# Patient Record
Sex: Female | Born: 1985 | Race: White | Hispanic: No | Marital: Married | Smoking: Current every day smoker
Health system: Southern US, Community
[De-identification: ages and names within clinical notes are randomized; demographics above are authoritative.]

## PROBLEM LIST (undated history)

## (undated) ENCOUNTER — Inpatient Hospital Stay (HOSPITAL_COMMUNITY): Payer: Self-pay

## (undated) DIAGNOSIS — E611 Iron deficiency: Secondary | ICD-10-CM

## (undated) DIAGNOSIS — Z8379 Family history of other diseases of the digestive system: Secondary | ICD-10-CM

## (undated) DIAGNOSIS — N390 Urinary tract infection, site not specified: Secondary | ICD-10-CM

## (undated) DIAGNOSIS — Z820 Family history of epilepsy and other diseases of the nervous system: Secondary | ICD-10-CM

## (undated) DIAGNOSIS — Z809 Family history of malignant neoplasm, unspecified: Secondary | ICD-10-CM

## (undated) DIAGNOSIS — Z8619 Personal history of other infectious and parasitic diseases: Secondary | ICD-10-CM

## (undated) DIAGNOSIS — Z833 Family history of diabetes mellitus: Secondary | ICD-10-CM

## (undated) DIAGNOSIS — F32A Depression, unspecified: Secondary | ICD-10-CM

## (undated) DIAGNOSIS — F1411 Cocaine abuse, in remission: Secondary | ICD-10-CM

## (undated) DIAGNOSIS — Z841 Family history of disorders of kidney and ureter: Secondary | ICD-10-CM

## (undated) DIAGNOSIS — K469 Unspecified abdominal hernia without obstruction or gangrene: Secondary | ICD-10-CM

## (undated) DIAGNOSIS — Z8249 Family history of ischemic heart disease and other diseases of the circulatory system: Secondary | ICD-10-CM

## (undated) DIAGNOSIS — Z8742 Personal history of other diseases of the female genital tract: Secondary | ICD-10-CM

## (undated) DIAGNOSIS — F329 Major depressive disorder, single episode, unspecified: Secondary | ICD-10-CM

## (undated) DIAGNOSIS — F419 Anxiety disorder, unspecified: Secondary | ICD-10-CM

## (undated) DIAGNOSIS — Z832 Family history of diseases of the blood and blood-forming organs and certain disorders involving the immune mechanism: Secondary | ICD-10-CM

## (undated) HISTORY — DX: Family history of disorders of kidney and ureter: Z84.1

## (undated) HISTORY — DX: Personal history of other diseases of the female genital tract: Z87.42

## (undated) HISTORY — DX: Family history of diabetes mellitus: Z83.3

## (undated) HISTORY — DX: Family history of malignant neoplasm, unspecified: Z80.9

## (undated) HISTORY — DX: Family history of other diseases of the digestive system: Z83.79

## (undated) HISTORY — DX: Personal history of other infectious and parasitic diseases: Z86.19

## (undated) HISTORY — DX: Family history of epilepsy and other diseases of the nervous system: Z82.0

## (undated) HISTORY — DX: Family history of diseases of the blood and blood-forming organs and certain disorders involving the immune mechanism: Z83.2

## (undated) HISTORY — DX: Iron deficiency: E61.1

## (undated) HISTORY — DX: Family history of ischemic heart disease and other diseases of the circulatory system: Z82.49

## (undated) HISTORY — DX: Urinary tract infection, site not specified: N39.0

---
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
## 2006-05-29 ENCOUNTER — Inpatient Hospital Stay (HOSPITAL_COMMUNITY): Admission: RE | Admit: 2006-05-29 | Discharge: 2006-06-02 | Payer: Self-pay | Admitting: Psychiatry

## 2006-05-29 ENCOUNTER — Emergency Department (HOSPITAL_COMMUNITY): Admission: EM | Admit: 2006-05-29 | Discharge: 2006-05-29 | Payer: Self-pay | Admitting: Emergency Medicine

## 2006-05-29 ENCOUNTER — Ambulatory Visit: Payer: Self-pay | Admitting: Psychiatry

## 2006-07-21 ENCOUNTER — Emergency Department (HOSPITAL_COMMUNITY): Admission: EM | Admit: 2006-07-21 | Discharge: 2006-07-22 | Payer: Self-pay | Admitting: Emergency Medicine

## 2007-04-03 ENCOUNTER — Ambulatory Visit: Payer: Self-pay | Admitting: Internal Medicine

## 2007-04-04 ENCOUNTER — Ambulatory Visit: Payer: Self-pay | Admitting: *Deleted

## 2009-09-30 ENCOUNTER — Emergency Department (HOSPITAL_COMMUNITY): Admission: EM | Admit: 2009-09-30 | Discharge: 2009-09-30 | Payer: Self-pay | Admitting: Emergency Medicine

## 2010-11-15 LAB — POCT PREGNANCY, URINE: Preg Test, Ur: NEGATIVE
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
## 2011-03-20 ENCOUNTER — Emergency Department (INDEPENDENT_AMBULATORY_CARE_PROVIDER_SITE_OTHER): Payer: Self-pay

## 2011-03-20 ENCOUNTER — Emergency Department (HOSPITAL_BASED_OUTPATIENT_CLINIC_OR_DEPARTMENT_OTHER)
Admission: EM | Admit: 2011-03-20 | Discharge: 2011-03-20 | Disposition: A | Discharge status: Home / Self Care | Payer: Self-pay | Attending: Emergency Medicine | Admitting: Emergency Medicine

## 2011-03-20 DIAGNOSIS — R1032 Left lower quadrant pain: Secondary | ICD-10-CM | POA: Insufficient documentation

## 2011-03-20 DIAGNOSIS — A499 Bacterial infection, unspecified: Secondary | ICD-10-CM | POA: Insufficient documentation

## 2011-03-20 DIAGNOSIS — F3289 Other specified depressive episodes: Secondary | ICD-10-CM | POA: Insufficient documentation

## 2011-03-20 DIAGNOSIS — N72 Inflammatory disease of cervix uteri: Secondary | ICD-10-CM

## 2011-03-20 DIAGNOSIS — F329 Major depressive disorder, single episode, unspecified: Secondary | ICD-10-CM | POA: Insufficient documentation

## 2011-03-20 DIAGNOSIS — B9689 Other specified bacterial agents as the cause of diseases classified elsewhere: Secondary | ICD-10-CM | POA: Insufficient documentation

## 2011-03-20 DIAGNOSIS — N760 Acute vaginitis: Secondary | ICD-10-CM | POA: Insufficient documentation

## 2011-03-20 HISTORY — DX: Unspecified abdominal hernia without obstruction or gangrene: K46.9

## 2011-03-20 HISTORY — DX: Anxiety disorder, unspecified: F41.9

## 2011-03-20 HISTORY — DX: Depression, unspecified: F32.A

## 2011-03-20 HISTORY — DX: Major depressive disorder, single episode, unspecified: F32.9

## 2011-03-20 LAB — URINALYSIS, ROUTINE W REFLEX MICROSCOPIC
Ketones, ur: NEGATIVE mg/dL
Leukocytes, UA: NEGATIVE
Nitrite: NEGATIVE
Protein, ur: NEGATIVE mg/dL
pH: 6.5 (ref 5.0–8.0)

## 2011-03-20 LAB — URINE MICROSCOPIC-ADD ON

## 2011-03-20 LAB — PREGNANCY, URINE: Preg Test, Ur: NEGATIVE

## 2011-03-20 LAB — WET PREP, GENITAL: WBC, Wet Prep HPF POC: NONE SEEN

## 2011-03-20 MED ORDER — METRONIDAZOLE 500 MG PO TABS: 500.0000 mg | ORAL_TABLET | Freq: Two times a day (BID) | ORAL | Status: AC

## 2011-03-20 MED ORDER — HYDROCODONE-ACETAMINOPHEN 5-500 MG PO TABS: 1.0000 | ORAL_TABLET | Freq: Four times a day (QID) | ORAL | Status: AC | PRN

## 2011-03-20 NOTE — ED Notes (Signed)
Pelvic Cart at bedside and ready for provider.

## 2011-03-20 NOTE — ED Provider Notes (Signed)
History     Chief Complaint  Patient presents with  . Abdominal Pain    LLQ pain   Patient is a 25 y.o. female presenting with abdominal pain. The history is provided by the patient. No language interpreter was used.  Abdominal Pain The primary symptoms of the illness include abdominal pain, nausea, vomiting and vaginal discharge. The primary symptoms of the illness do not include fever or dysuria. The current episode started 13 to 24 hours ago. The onset of the illness was sudden. The problem has not changed since onset. The vaginal discharge is not associated with dysuria.   The patient has not had a change in bowel habit. Symptoms associated with the illness do not include constipation. Significant associated medical issues do not include liver disease, substance abuse or diverticulitis.    Past Medical History  Diagnosis Date  . Depression   . Anxiety   . Hernia     History reviewed. No pertinent past surgical history.  No family history on file.  History  Substance Use Topics  . Smoking status: Current Some Day Smoker  . Smokeless tobacco: Not on file  . Alcohol Use: Yes     6 pack/ day    OB History    Grav Para Term Preterm Abortions TAB SAB Ect Mult Living                  Review of Systems  Constitutional: Negative for fever.  Gastrointestinal: Positive for nausea, vomiting and abdominal pain. Negative for constipation.  Genitourinary: Positive for vaginal discharge. Negative for dysuria.  All other systems reviewed and are negative.    Physical Exam  BP 119/77  Pulse 77  Temp(Src) 98.4 F (36.9 C) (Oral)  Resp 16  Ht 4\' 9"  (1.448 m)  Wt 105 lb (47.628 kg)  BMI 22.72 kg/m2  LMP 03/17/2011  Physical Exam  Vitals reviewed. Constitutional: She is oriented to person, place, and time. She appears well-developed and well-nourished.  Neck: Normal range of motion. Neck supple.  Cardiovascular: Normal rate and regular rhythm.   Pulmonary/Chest: Effort  normal.  Abdominal: Soft. Bowel sounds are normal. She exhibits no distension. There is no tenderness. There is no CVA tenderness.  Genitourinary: Cervix exhibits no motion tenderness. Right adnexum displays no tenderness. Left adnexum displays no tenderness.  Neurological: She is alert and oriented to person, place, and time.  Skin: Skin is warm and dry.  Psychiatric: She has a normal mood and affect.    ED Course  Procedures  Results for orders placed during the hospital encounter of 03/20/11  URINALYSIS, ROUTINE W REFLEX MICROSCOPIC      Component Value Range   Color, Urine YELLOW  YELLOW    Appearance CLOUDY (*) CLEAR    Specific Gravity, Urine 1.012  1.005 - 1.030    pH 6.5  5.0 - 8.0    Glucose, UA NEGATIVE  NEGATIVE (mg/dL)   Hgb urine dipstick TRACE (*) NEGATIVE    Bilirubin Urine NEGATIVE  NEGATIVE    Ketones, ur NEGATIVE  NEGATIVE (mg/dL)   Protein, ur NEGATIVE  NEGATIVE (mg/dL)   Urobilinogen, UA 0.2  0.0 - 1.0 (mg/dL)   Nitrite NEGATIVE  NEGATIVE    Leukocytes, UA NEGATIVE  NEGATIVE   PREGNANCY, URINE      Component Value Range   Preg Test, Ur NEGATIVE    WET PREP, GENITAL      Component Value Range   Yeast, Wet Prep NONE SEEN  NONE SEEN  Trich, Wet Prep NONE SEEN  NONE SEEN    Clue Cells, Wet Prep FEW (*) NONE SEEN    WBC, Wet Prep HPF POC NONE SEEN  NONE SEEN   URINE MICROSCOPIC-ADD ON      Component Value Range   Squamous Epithelial / LPF FEW (*) RARE    WBC, UA 0-2  <3 (WBC/hpf)   RBC / HPF 0-2  <3 (RBC/hpf)   US Transvaginal Non-ob  03/20/2011  *RADIOLOGY REPORT*  Clinical Data: Left lower abdominal pain.  TRANSABDOMINAL AND TRANSVAGINAL ULTRASOUND OF PELVIS  Technique:  Both transabdominal and transvaginal ultrasound examinations of the pelvis were performed including evaluation of the uterus, ovaries, adnexal regions, and pelvic cul-de-sac.  Comparison: None.  Findings:  Uterus: 5.7 x 2.6 x 3.0 cm.  Small Nabothian cyst.  Normal echotexture.  No other  focal abnormality.  Endometrium: Normal in appearance and thickness, 4 mm.  Right Ovary: 3.2 x 2.6 x 2.4 cm.  Multiple small follicles.  Left Ovary: 2.7 x 2.3 x 2.9 cm.  Multiple small follicles.  Other Findings:  Trace free fluid in the pelvis.  This is likely physiologic.  IMPRESSION: Unremarkable study.  Original Report Authenticated By: Cyndie Chime, M.D.   US Pelvis Complete  03/20/2011  *RADIOLOGY REPORT*  Clinical Data: Left lower abdominal pain.  TRANSABDOMINAL AND TRANSVAGINAL ULTRASOUND OF PELVIS  Technique:  Both transabdominal and transvaginal ultrasound examinations of the pelvis were performed including evaluation of the uterus, ovaries, adnexal regions, and pelvic cul-de-sac.  Comparison: None.  Findings:  Uterus: 5.7 x 2.6 x 3.0 cm.  Small Nabothian cyst.  Normal echotexture.  No other focal abnormality.  Endometrium: Normal in appearance and thickness, 4 mm.  Right Ovary: 3.2 x 2.6 x 2.4 cm.  Multiple small follicles.  Left Ovary: 2.7 x 2.3 x 2.9 cm.  Multiple small follicles.  Other Findings:  Trace free fluid in the pelvis.  This is likely physiologic.  IMPRESSION: Unremarkable study.  Original Report Authenticated By: Cyndie Chime, M.D.      MDM No infection in urine:pt not pregnant:no sign of cyst or torsion:unlikely diverticulitis:pt refusing blood work:will treat for bv and symptomatically      Teressa Lower, NP 03/20/11 1404

## 2011-03-20 NOTE — ED Notes (Signed)
LLQ pain since yesterday;; nausea with no vomiting; some diarrhea this am; does have abnormal vaginal d/c as well

## 2011-03-21 LAB — GC/CHLAMYDIA PROBE AMP, GENITAL: Chlamydia, DNA Probe: NEGATIVE

## 2011-03-21 NOTE — ED Provider Notes (Signed)
Medical screening examination/treatment/procedure(s) were performed by non-physician practitioner and as supervising physician I was immediately available for consultation/collaboration.  Riley Lam [facility] 03/21/11 1543

## 2011-08-28 NOTE — L&D Delivery Note (Signed)
Delivery Note  At 1450 attempt to have pt push, and was ineffective, then tried several position changes and pt was eventually able to push, FHR remained reassuring Anesthesia was called to BS to redose epidural    At 4:13 PM a viable female was delivered via Vaginal, Spontaneous Delivery (Presentation: Right Occiput Anterior). Shoulders delivered easily,  APGAR: 8, 9; weight 7 lb 1 oz (3204 g).   Placenta status: Intact, Spontaneous, sent to pathology for dx of chorioamnionitis  Pathology.  Cord: 3 vessels with the following complications: None.  Cord pH: n/a  Anesthesia: Epidural  Episiotomy: None Lacerations: 2nd degree;Perineal Suture Repair: 3.0 vicryl rapide Est. Blood Loss (mL): 200  Mom to postpartum.  Baby to nursery-stable. Skin-skin w pt Plans to BF   Zykiria Bruening M 05/26/2012, 9:32 PM

## 2011-11-06 ENCOUNTER — Encounter (INDEPENDENT_AMBULATORY_CARE_PROVIDER_SITE_OTHER): Payer: Medicaid Other

## 2011-11-06 ENCOUNTER — Other Ambulatory Visit: Payer: Self-pay

## 2011-11-06 DIAGNOSIS — Z331 Pregnant state, incidental: Secondary | ICD-10-CM

## 2011-11-06 LAB — OB RESULTS CONSOLE HIV ANTIBODY (ROUTINE TESTING): HIV: NONREACTIVE

## 2011-11-06 LAB — OB RESULTS CONSOLE RPR: RPR: NONREACTIVE

## 2011-11-06 LAB — OB RESULTS CONSOLE HEPATITIS B SURFACE ANTIGEN: Hepatitis B Surface Ag: NEGATIVE

## 2011-11-06 LAB — OB RESULTS CONSOLE ABO/RH: RH Type: POSITIVE

## 2011-11-07 ENCOUNTER — Encounter (HOSPITAL_COMMUNITY): Payer: Self-pay | Admitting: MS"

## 2011-11-14 ENCOUNTER — Ambulatory Visit (HOSPITAL_COMMUNITY)
Admission: RE | Admit: 2011-11-14 | Discharge: 2011-11-14 | Disposition: A | Discharge status: Home / Self Care | Payer: Medicaid Other | Source: Ambulatory Visit | Attending: Obstetrics and Gynecology | Admitting: Obstetrics and Gynecology

## 2011-11-14 ENCOUNTER — Other Ambulatory Visit: Payer: Self-pay | Admitting: Obstetrics and Gynecology

## 2011-11-14 ENCOUNTER — Ambulatory Visit (HOSPITAL_COMMUNITY): Admission: RE | Admit: 2011-11-14 | Payer: Medicaid Other | Source: Ambulatory Visit

## 2011-11-14 ENCOUNTER — Encounter (HOSPITAL_COMMUNITY): Payer: Self-pay

## 2011-11-14 DIAGNOSIS — Z369 Encounter for antenatal screening, unspecified: Secondary | ICD-10-CM

## 2011-11-14 DIAGNOSIS — Z3687 Encounter for antenatal screening for uncertain dates: Secondary | ICD-10-CM

## 2011-11-14 DIAGNOSIS — Z0489 Encounter for examination and observation for other specified reasons: Secondary | ICD-10-CM

## 2011-11-14 DIAGNOSIS — Z3689 Encounter for other specified antenatal screening: Secondary | ICD-10-CM

## 2011-11-14 DIAGNOSIS — O99891 Other specified diseases and conditions complicating pregnancy: Secondary | ICD-10-CM | POA: Insufficient documentation

## 2011-11-15 ENCOUNTER — Ambulatory Visit (HOSPITAL_COMMUNITY): Payer: Self-pay

## 2011-11-15 ENCOUNTER — Encounter (HOSPITAL_COMMUNITY): Payer: Self-pay

## 2011-11-16 NOTE — Progress Notes (Signed)
Genetic Counseling  High-Risk Gestation Note  Appointment Date:  11/16/2011 Referred By: Michael Litter, MD Date of Birth:  08/13/1986     Pregnancy History: G2P0010 Estimated Date of Delivery: 05/20/12 Estimated Gestational Age: [redacted]w[redacted]d Attending: Rica Koyanagi, MD   [name] was seen for genetic counseling because of Xanax use in pregnancy.     [name] reported taking alprazolam (0.5 mg) approximately 3 times per week and is attempting to wean from this medication. She reported that this is prescribed by her family physician, and that she planned to make an appointment at [facility] once she left her appointment in our office today.    Before assessing any risk to a pregnancy, it is important to know that any time a couple conceives, there is a 3-5% chance that the baby will be affected by a birth defect or have mental retardation. This is the background risk that all couples in the general population face. For this reason, it is often difficult to determine if a drug is responsible for causing a pregnancy loss or birth defect. Therefore, in many cases, we cannot provide specific information and can only caution about possible associations between a certain exposure and a specific birth defect. We discussed that the critical period for fetal organ development is from the third through the eighth weeks after conception. Exposures during this time might have the potential for disrupting the beginning of the development of the biological systems.   Many factors contribute to what the effects, if any, a prenatal exposure will have on a pregnancy. Drug dosage (the quantity of drug exposed), timing (when during pregnancy did the exposure occur), the number of exposures to a substance, any drug-drug interactions with other substances taken during pregnancy, the genetic make-up of the mother, and the genetic make-up of a fetus all contribute to the consequences of any  prenatal exposure(s). We discussed that we are not able to assess all of these factors when assessing the risk of an exposure in pregnancy.   Available animal study data do not suggest an increased risk for birth defects with prenatal alprazolam (Xanax) use, except at very high dose exposure. Available human data regarding Xanax use in pregnancy have not indicated an increased risk for birth defects. We reviewed that other benzodiazepines have been suggested to increase the risk for birth defects, such as cleft lip and palate. However, this association has not been proven. We discussed that third trimester use of alprazolam is associated with an increased risk for neonatal withdrawal symptoms.   Even though a limited number of medicines are known to cause birth defects, we cannot say that it is completely safe to use any medicines during pregnancy.  It is also not possible to predict any drug-drug interactions that occur, or how they might affect a pregnancy.  The use of medications is recommended in pregnancy only if the benefit to the mother (and thus the pregnancy) outweighs potential risks to the baby.  Sometimes the maternal use of medications, dictated by a medical condition, may even be more beneficial to a pregnancy than not taking the medication(s) at all.   [name] denied exposure to environmental toxins. She denied the use of tobacco or street drugs. She reported drinking wine approximately 2 weeks ago.  We discussed that prenatal alcohol exposure can increase the risk for growth delays, small head size, heart defects, eye and facial differences, as well as behavior problems and learning disabilities. The risk of these  to occur tends to increase with the amount of alcohol consumed. However, because there is no identified safe amount of alcohol in pregnancy, it is recommended to completely avoid alcohol in pregnancy. Given the reported amount of exposure, risk for associated effects are likely  low in the current pregnancy. She denied significant viral illnesses during the course of her pregnancy. Her medical and surgical histories were noncontributory.   An ultrasound was performed at the time of today's visit to establish gestational age. The gestational age was consistent with 13 weeks 1 day, changing the EDC to 05/20/12. Complete ultrasound results reported separately. We discussed the screening options of First screen and Quad screen. The patient understands that screening adjusts the a priori risk (typically based on age) for certain conditions to provide a pregnancy specific risk assessment. We reviewed chromosomes, nondisjunction, and examples of chromosome conditions, including down syndrome. We discussed that given the patient's age alone, the pregnancy is not considered to be at increased risk for aneuploidy. After reviewing these options, [name] elected to have ultrasound only, but declined First screen.    [name] was provided with written information regarding cystic fibrosis (CF) including the carrier frequency and incidence in the Caucasian population, the availability of carrier testing and prenatal diagnosis if indicated.  In addition, we discussed that CF is routinely screened for as part of the [place] newborn screening panel.  She declined testing today.   Both family histories were reviewed and found to be contributory for anxiety. The patient reported that she and a paternal aunt have anxiety. The father of the pregnancy has attention deficit hyperactivity disorder (ADHD). Mental health and behavioral disorders, such as anxiety and ADHD are typically thought to be due to multifactorial inheritance (combination of environmental and hereditary factors). We discussed that recurrence risk for these conditions would be increased for the couple's child(ren). She understands that we are not able to screen or test prenatally for these conditions. It would be  important for the couple's pediatrician to be aware of this history so that children can be screened and followed appropriately. Without further information regarding the provided family history, an accurate genetic risk cannot be calculated. Further genetic counseling is warranted if more information is obtained.  I counseled [name] regarding the above risks and available options.  The approximate face-to-face time with the genetic counselor was 35 minutes.  Quinn Plowman, MS,  Certified [employer] 11/16/2011

## 2011-11-23 ENCOUNTER — Emergency Department (HOSPITAL_COMMUNITY)
Admission: EM | Admit: 2011-11-23 | Discharge: 2011-11-23 | Disposition: A | Discharge status: Home / Self Care | Payer: Medicaid Other | Attending: Emergency Medicine | Admitting: Emergency Medicine

## 2011-11-23 ENCOUNTER — Encounter (HOSPITAL_COMMUNITY): Payer: Self-pay

## 2011-11-23 DIAGNOSIS — O210 Mild hyperemesis gravidarum: Secondary | ICD-10-CM | POA: Insufficient documentation

## 2011-11-23 DIAGNOSIS — R110 Nausea: Secondary | ICD-10-CM

## 2011-11-23 DIAGNOSIS — R42 Dizziness and giddiness: Secondary | ICD-10-CM | POA: Insufficient documentation

## 2011-11-23 DIAGNOSIS — F341 Dysthymic disorder: Secondary | ICD-10-CM | POA: Insufficient documentation

## 2011-11-23 DIAGNOSIS — Z3490 Encounter for supervision of normal pregnancy, unspecified, unspecified trimester: Secondary | ICD-10-CM

## 2011-11-23 LAB — URINALYSIS, ROUTINE W REFLEX MICROSCOPIC
Ketones, ur: 15 mg/dL — AB
Leukocytes, UA: NEGATIVE
Nitrite: NEGATIVE
Protein, ur: NEGATIVE mg/dL

## 2011-11-23 LAB — POCT I-STAT, CHEM 8
BUN: 3 mg/dL — ABNORMAL LOW (ref 6–23)
Calcium, Ion: 1.17 mmol/L (ref 1.12–1.32)
Chloride: 103 meq/L (ref 96–112)
Creatinine, Ser: 0.6 mg/dL (ref 0.50–1.10)
Glucose, Bld: 97 mg/dL (ref 70–99)
HCT: 36 % (ref 36.0–46.0)
Hemoglobin: 12.2 g/dL (ref 12.0–15.0)
Potassium: 3.4 meq/L — ABNORMAL LOW (ref 3.5–5.1)
Sodium: 137 meq/L (ref 135–145)
TCO2: 22 mmol/L (ref 0–100)

## 2011-11-23 MED ORDER — MECLIZINE HCL 25 MG PO TABS
25.0000 mg | ORAL_TABLET | Freq: Once | ORAL | Status: AC
  Administered 2011-11-23: 25 mg via ORAL
  Filled 2011-11-23: qty 1

## 2011-11-23 MED ORDER — MECLIZINE HCL 50 MG PO TABS: ORAL_TABLET | ORAL | Status: DC

## 2011-11-23 MED ORDER — ONDANSETRON HCL 4 MG/2ML IJ SOLN
4.0000 mg | Freq: Once | INTRAMUSCULAR | Status: AC
  Administered 2011-11-23: 4 mg via INTRAVENOUS
  Filled 2011-11-23: qty 2

## 2011-11-23 MED ORDER — ONDANSETRON HCL 4 MG PO TABS: 4.0000 mg | ORAL_TABLET | Freq: Four times a day (QID) | ORAL | Status: AC

## 2011-11-23 MED ORDER — SODIUM CHLORIDE 0.9 % IV BOLUS (SEPSIS)
1000.0000 mL | Freq: Once | INTRAVENOUS | Status: AC
  Administered 2011-11-23: 1000 mL via INTRAVENOUS

## 2011-11-23 NOTE — ED Notes (Signed)
Pt ambulatory to restroom

## 2011-11-23 NOTE — Discharge Instructions (Signed)
Stay well hydrated. Take zofran as needed for nausea and meclizine as needed for dizziness. Follow up with ObGyn in the next few days for recheck of ongoing symptoms but return to ER for changing or worsening of symptoms.   Benign Positional Vertigo Vertigo means you feel like you or your surroundings are moving when they are not. Benign positional vertigo is the most common form of vertigo. Benign means that the cause of your condition is not serious. Benign positional vertigo is more common in older adults. CAUSES  Benign positional vertigo is the result of an upset in the labyrinth system. This is an area in the middle ear that helps control your balance. This may be caused by a viral infection, head injury, or repetitive motion. However, often no specific cause is found. SYMPTOMS  Symptoms of benign positional vertigo occur when you move your head or eyes in different directions. Some of the symptoms may include:  Loss of balance and falls.   Vomiting.   Blurred vision.   Dizziness.   Nausea.   Involuntary eye movements (nystagmus).  DIAGNOSIS  Benign positional vertigo is usually diagnosed by physical exam. If the specific cause of your benign positional vertigo is unknown, your caregiver may perform imaging tests, such as magnetic resonance imaging (MRI) or computed tomography (CT). TREATMENT  Your caregiver may recommend movements or procedures to correct the benign positional vertigo. Medicines such as meclizine, benzodiazepines, and medicines for nausea may be used to treat your symptoms. In rare cases, if your symptoms are caused by certain conditions that affect the inner ear, you may need surgery. HOME CARE INSTRUCTIONS   Follow your caregiver's instructions.   Move slowly. Do not make sudden body or head movements.   Avoid driving.   Avoid operating heavy machinery.   Avoid performing any tasks that would be dangerous to you or others during a vertigo episode.   Drink  enough fluids to keep your urine clear or pale yellow.  SEEK IMMEDIATE MEDICAL CARE IF:   You develop problems with walking, weakness, numbness, or using your arms, hands, or legs.   You have difficulty speaking.   You develop severe headaches.   Your nausea or vomiting continues or gets worse.   You develop visual changes.   Your family or friends notice any behavioral changes.   Your condition gets worse.   You have a fever.   You develop a stiff neck or sensitivity to light.  MAKE SURE YOU:   Understand these instructions.   Will watch your condition.   Will get help right away if you are not doing well or get worse.  Document Released: 05/21/2006 Document Revised: 08/02/2011 Document Reviewed: 05/03/2011 [facility] Patient Information 2012 [place], [place].

## 2011-11-23 NOTE — ED Notes (Signed)
Pt states she has had vomiting that started yesterday with multiple episodes.  Dizziness began last night.

## 2011-11-23 NOTE — Progress Notes (Signed)
Obstetric ultrasound performed today.   Fetal measurements do not agree with dating by LMP.  Recommend dating pregnancy by ultrasound today.   Ultrasound findings were discussed and the patient received genetic counseling (see separate report).  She declined screening for fetal chromosome anomalites.    Risks of Xanax use during pregnancy were discussed including but not limited to fetal growth abnormalities and neonatal abstinence syndromes.  Alternative management approaches to anxiety were encouraged and the need to carefully weigh risks and benefits with further benzodiazepine use described.  Patient has an appointment at [facility] to establish care with a mental health provider.  Precautions were given.   Recommend repeat ultrasound at [redacted] weeks gestation for evaluation of fetal anatomy.  We would be pleased to perform this exam.  If desired, please call to schedule.   If patient remains on benzodiazepine therapy, recommend serial ultrasound evaluation of fetal growth through the third trimester.   Please see full report in [facility].

## 2011-11-23 NOTE — ED Notes (Signed)
Pt passed PO challenge.  Pt able to tolerate liquids at this time.

## 2011-11-23 NOTE — ED Provider Notes (Signed)
History     CSN: [id number]  Arrival date & time 11/23/11  4098   First MD Initiated Contact with Patient 11/23/11 [phone]      Chief Complaint  Patient presents with  . Dizziness    (Consider location/radiation/quality/duration/timing/severity/associated sxs/prior treatment) HPI  Patient presents to emergency department complaining of vomiting and dizziness. Patient states that shortly after lunch yesterday she began to have vomiting acutely. Patient states that she had approximately 5 or 6 episodes of vomiting from lunch until supper. Patient states at suppertime she is able to hold down a small amount rice but about an hour later began to have dizziness. Patient states that upon lying to sitting or sitting to standing she would feel acutely dizzy. Patient states when she would try to walk she felt like the room was spinning. Patient states this persisted throughout the night with some intermittent dizziness when she tried to lay down to sleep. Patient states she's currently [redacted] weeks pregnant and has seen her OB/GYN once at [facility]. Patient is G2 P 0 A1. Patient states she's had some mild morning sickness with this pregnancy but no other complications. She denies any vaginal bleeding. She denies fevers, chills, upper respiratory tract infection symptoms, chest pain, shortness of breath, abdominal pain, diarrhea, vaginal bleeding, vaginal discharge, dysuria, hematuria. Patient takes xanax on an as-needed basis for anxiety and depression has no other known medical problems takes no other medicines on regular basis. Vomiting was acute onset, and resolve but had ongoing nausea. Dizziness was acute onset, intermittent, and persistent. Dizziness is aggravated by movement improved with lying still.  Past Medical History  Diagnosis Date  . Depression   . Anxiety   . Hernia     History reviewed. No pertinent past surgical history.  No family history on file.  History  Substance  Use Topics  . Smoking status: Current Some Day Smoker  . Smokeless tobacco: Not on file  . Alcohol Use: No     6 pack/ day    OB History    Grav Para Term Preterm Abortions TAB SAB Ect Mult Living   2 0 0 0 1 1 0 0 0 0       Review of Systems  All other systems reviewed and are negative.    Allergies  Review of patient's allergies indicates no known allergies.  Home Medications   Current Outpatient Rx  Name Route Sig Dispense Refill  . ALPRAZOLAM 0.5 MG PO TABS Oral Take 0.5 mg by mouth 3 (three) times daily as needed. For anxiety    . PRENATAL VITAMINS PO Oral Take 1 tablet by mouth daily.       BP 99/58  Pulse 80  Temp(Src) 97 F (36.1 C) (Oral)  Resp 18  SpO2 100%  LMP 07/28/2011  Physical Exam  Nursing note and vitals reviewed. Constitutional: She is oriented to person, place, and time. She appears well-developed and well-nourished. No distress.  HENT:  Head: Normocephalic and atraumatic.       Mucus membranes moist and pink  Eyes: Conjunctivae and EOM are normal. Pupils are equal, round, and reactive to light.       NO vertical or horizontal nystagmus  Neck: Normal range of motion. Neck supple.  Cardiovascular: Normal rate, regular rhythm, normal heart sounds and intact distal pulses.  Exam reveals no gallop and no friction rub.   No murmur heard. Pulmonary/Chest: Effort normal and breath sounds normal. No respiratory distress. She has no wheezes. She has  no rales. She exhibits no tenderness.  Abdominal: Soft. Bowel sounds are normal. She exhibits no distension and no mass. There is no tenderness. There is no rebound and no guarding.  Musculoskeletal: Normal range of motion. She exhibits no edema and no tenderness.  Neurological: She is alert and oriented to person, place, and time. No cranial nerve deficit. Coordination normal.  Skin: Skin is warm and dry. No rash noted. She is not diaphoretic. No erythema.  Psychiatric: She has a normal mood and affect.     ED Course  Procedures (including critical care time)  IV fluids, PO meclizine. IV zofran.   9:25 AM Patient is ambulating without difficulty and no ataxia  Labs Reviewed - No data to display No results found.   1. Dizziness   2. Nausea   3. Intrauterine normal pregnancy       MDM  Dizziness resolved after PO meclizine and fluids. Patient angulated without difficulty without signs or symptoms of ataxia or central vertigo. Patient denies any associated abdominal pain with abdomen soft and nontender. Patient has no horizontal or vertical nystagmus. No neuro focal findings. Denies vaginal bleeding.         [place], [place] 11/23/11 1023

## 2011-11-23 NOTE — ED Provider Notes (Signed)
Medical screening examination/treatment/procedure(s) were performed by non-physician practitioner and as supervising physician I was immediately available for consultation/collaboration.   Forbes Cellar, MD 11/23/11 1100

## 2011-11-23 NOTE — ED Notes (Signed)
Pt states slight dizziness in all three positions.

## 2011-11-30 ENCOUNTER — Encounter (INDEPENDENT_AMBULATORY_CARE_PROVIDER_SITE_OTHER): Payer: Medicaid Other | Admitting: Obstetrics and Gynecology

## 2011-11-30 ENCOUNTER — Other Ambulatory Visit: Payer: Self-pay

## 2011-11-30 DIAGNOSIS — Z3689 Encounter for other specified antenatal screening: Secondary | ICD-10-CM

## 2011-11-30 DIAGNOSIS — N898 Other specified noninflammatory disorders of vagina: Secondary | ICD-10-CM

## 2011-11-30 DIAGNOSIS — Z331 Pregnant state, incidental: Secondary | ICD-10-CM

## 2011-12-26 ENCOUNTER — Ambulatory Visit (INDEPENDENT_AMBULATORY_CARE_PROVIDER_SITE_OTHER): Payer: Medicaid Other | Admitting: Obstetrics and Gynecology

## 2011-12-26 ENCOUNTER — Ambulatory Visit (INDEPENDENT_AMBULATORY_CARE_PROVIDER_SITE_OTHER): Payer: Medicaid Other

## 2011-12-26 ENCOUNTER — Other Ambulatory Visit: Payer: Medicaid Other

## 2011-12-26 ENCOUNTER — Encounter: Payer: Self-pay | Admitting: Obstetrics and Gynecology

## 2011-12-26 VITALS — BP 90/64 | Wt 109.0 lb

## 2011-12-26 DIAGNOSIS — F419 Anxiety disorder, unspecified: Secondary | ICD-10-CM | POA: Insufficient documentation

## 2011-12-26 DIAGNOSIS — Z3689 Encounter for other specified antenatal screening: Secondary | ICD-10-CM

## 2011-12-26 DIAGNOSIS — F411 Generalized anxiety disorder: Secondary | ICD-10-CM

## 2011-12-26 DIAGNOSIS — F329 Major depressive disorder, single episode, unspecified: Secondary | ICD-10-CM | POA: Insufficient documentation

## 2011-12-26 DIAGNOSIS — Z3400 Encounter for supervision of normal first pregnancy, unspecified trimester: Secondary | ICD-10-CM

## 2011-12-26 LAB — US OB COMP + 14 WK

## 2011-12-26 NOTE — Patient Instructions (Signed)
Please give pamphlet low back pain in pregnancy.

## 2011-12-26 NOTE — Progress Notes (Signed)
Ultrasound shows:  SIUP  S=D     [place] EDD: c/w dates            AFI: normal           Cervical length: 3.65 cm           Placenta localization: anterior           Fetal presentation: breech                    Anatomy survey is normal           Gender : DNWTK Difficulty sleeping and LBP with position changes Pt decreasing Xanax. Only taking 1 every few days prn. Will try benadryl if doesn't help she may want to try ambien Pamphlet on LBP in pregnancy RTO 4wks

## 2012-01-25 ENCOUNTER — Encounter: Payer: Self-pay | Admitting: Obstetrics and Gynecology

## 2012-01-25 ENCOUNTER — Ambulatory Visit (INDEPENDENT_AMBULATORY_CARE_PROVIDER_SITE_OTHER): Payer: Medicaid Other | Admitting: Obstetrics and Gynecology

## 2012-01-25 VITALS — BP 102/56 | Wt 113.0 lb

## 2012-01-25 DIAGNOSIS — Z331 Pregnant state, incidental: Secondary | ICD-10-CM

## 2012-01-25 NOTE — Patient Instructions (Signed)
Patient Education Materials to be provided at check out (*indicates is located in accordion folder):  Easing Back Pain During Pregnancy  

## 2012-01-25 NOTE — Progress Notes (Signed)
Pt without c/o glucola @NV  Pt occ takes xanax prn

## 2012-01-29 ENCOUNTER — Telehealth: Payer: Self-pay | Admitting: Obstetrics and Gynecology

## 2012-01-29 NOTE — Telephone Encounter (Signed)
TC to pt. LM to return call returning call.

## 2012-01-29 NOTE — Telephone Encounter (Signed)
Triage/cht received 

## 2012-02-18 ENCOUNTER — Telehealth: Payer: Self-pay | Admitting: Obstetrics and Gynecology

## 2012-02-18 NOTE — Telephone Encounter (Signed)
PT CALLED, STATES HAS 1 HR GLUCOSE TESTING SCHED FOR Friday, WAS TOLD BY A FRIEND THAT IS A PT HERE THAT INSTEAD OF THE DRINK CAN DO JELLY BEANS INSTEAD.  INFORMED PT JELLY BEANS ARE AVAILABLE, PT TO ASK FOR JELLY BEANS @ APPT, PT VOICES UNDERSTANDING.

## 2012-02-18 NOTE — Telephone Encounter (Signed)
Triage/ob/epic °

## 2012-02-22 ENCOUNTER — Ambulatory Visit (INDEPENDENT_AMBULATORY_CARE_PROVIDER_SITE_OTHER): Payer: Medicaid Other | Admitting: Obstetrics and Gynecology

## 2012-02-22 ENCOUNTER — Encounter: Payer: Self-pay | Admitting: Obstetrics and Gynecology

## 2012-02-22 VITALS — BP 100/58 | Wt 116.0 lb

## 2012-02-22 DIAGNOSIS — Z331 Pregnant state, incidental: Secondary | ICD-10-CM

## 2012-02-22 NOTE — Progress Notes (Signed)
C/o L side low back pain Jelly beans given today without difficulty

## 2012-02-22 NOTE — Progress Notes (Signed)
Patient ID: Kristi Perkins, female   DOB: [birth year], 26 y.o.   MRN: [id number] Reviewed s/s preterm labor, srom, vag bleeding,daily kick counts to report, encouraged 8 water daily and frequent voids. 1 gtt, rpr, hgb today. A positive. Lavera Guise, CNM

## 2012-02-23 LAB — RPR

## 2012-02-26 ENCOUNTER — Encounter: Payer: Self-pay | Admitting: Obstetrics and Gynecology

## 2012-02-26 DIAGNOSIS — Z8742 Personal history of other diseases of the female genital tract: Secondary | ICD-10-CM

## 2012-02-26 DIAGNOSIS — E611 Iron deficiency: Secondary | ICD-10-CM

## 2012-03-07 ENCOUNTER — Encounter: Payer: Self-pay | Admitting: Obstetrics and Gynecology

## 2012-03-07 ENCOUNTER — Ambulatory Visit (INDEPENDENT_AMBULATORY_CARE_PROVIDER_SITE_OTHER): Payer: Medicaid Other | Admitting: Obstetrics and Gynecology

## 2012-03-07 VITALS — BP 104/66 | Wt 121.0 lb

## 2012-03-07 DIAGNOSIS — Z3490 Encounter for supervision of normal pregnancy, unspecified, unspecified trimester: Secondary | ICD-10-CM

## 2012-03-07 DIAGNOSIS — Z331 Pregnant state, incidental: Secondary | ICD-10-CM

## 2012-03-07 NOTE — Progress Notes (Signed)
No complaints except mild cramping after walking awhile Reviewed neg glucola FKCs RTO 2wks

## 2012-03-21 ENCOUNTER — Ambulatory Visit (INDEPENDENT_AMBULATORY_CARE_PROVIDER_SITE_OTHER): Payer: Medicaid Other | Admitting: Obstetrics and Gynecology

## 2012-03-21 ENCOUNTER — Encounter: Payer: Self-pay | Admitting: Obstetrics and Gynecology

## 2012-03-21 VITALS — BP 112/56 | Ht <= 58 in | Wt 123.0 lb

## 2012-03-21 DIAGNOSIS — Z3480 Encounter for supervision of other normal pregnancy, unspecified trimester: Secondary | ICD-10-CM

## 2012-03-21 NOTE — Progress Notes (Signed)
Pt c/o trouble sleeping x1 month. Pt has tried Benadryl without improvement.

## 2012-03-21 NOTE — Progress Notes (Signed)
Pt with several concerns.   1. Pt with leaking of clear fluid from right breast.  No bleeding or green discharge.  Both breasts examined.  No masses.  They are both full but not engorged.  No axillary masses B 2.  Pt with left ankle pain.  No trauma.  No swelling and FROM.  If pt has swelling will get x ray 3. Pt having trouble sleeping.  Recommend tylenol PM or unisom 4. Pt taking xanax.  sshe is aware that this is a teratogen aand CAT D

## 2012-04-04 ENCOUNTER — Other Ambulatory Visit: Payer: Self-pay | Admitting: Obstetrics and Gynecology

## 2012-04-04 ENCOUNTER — Ambulatory Visit
Admission: RE | Admit: 2012-04-04 | Discharge: 2012-04-04 | Disposition: A | Discharge status: Home / Self Care | Payer: Medicaid Other | Source: Ambulatory Visit | Attending: Obstetrics and Gynecology | Admitting: Obstetrics and Gynecology

## 2012-04-04 ENCOUNTER — Encounter: Payer: Self-pay | Admitting: Obstetrics and Gynecology

## 2012-04-04 ENCOUNTER — Ambulatory Visit (INDEPENDENT_AMBULATORY_CARE_PROVIDER_SITE_OTHER): Payer: Medicaid Other | Admitting: Obstetrics and Gynecology

## 2012-04-04 VITALS — BP 110/62 | Wt 120.0 lb

## 2012-04-04 DIAGNOSIS — M79609 Pain in unspecified limb: Secondary | ICD-10-CM

## 2012-04-04 DIAGNOSIS — M79673 Pain in unspecified foot: Secondary | ICD-10-CM

## 2012-04-04 NOTE — Patient Instructions (Signed)
Preterm Labor Preterm labor is when labor starts at less than 37 weeks of pregnancy. The normal length of a pregnancy is 39 to 41 weeks. CAUSES Often, there is no identifiable underlying cause as to why a woman goes into preterm labor. However, one of the most common known causes of preterm labor is infection. Infections of the uterus, cervix, vagina, amniotic sac, bladder, kidney, or even the lungs (pneumonia) can cause labor to start. Other causes of preterm labor include:  Urogenital infections, such as yeast infections and bacterial vaginosis.   Uterine abnormalities (uterine shape, uterine septum, fibroids, bleeding from the placenta).   A cervix that has been operated on and opens prematurely.   Malformations in the baby.   Multiple gestations (twins, triplets, and so on).   Breakage of the amniotic sac.  Additional risk factors for preterm labor include:  Previous history of preterm labor.   Premature rupture of membranes (PROM).   A placenta that covers the opening of the cervix (placenta previa).   A placenta that separates from the uterus (placenta abruption).   A cervix that is too weak to hold the baby in the uterus (incompetence cervix).   Having too much fluid in the amniotic sac (polyhydramnios).   Taking illegal drugs or smoking while pregnant.   Not gaining enough weight while pregnant.   Women younger than 18 and older than 25 years old.   Low socioeconomic status.   African-American ethnicity.  SYMPTOMS Signs and symptoms of preterm labor include:  Menstrual-like cramps.   Contractions that are 30 to 70 seconds apart, become very regular, closer together, and are more intense and painful.   Contractions that start on the top of the uterus and spread down to the lower abdomen and back.   A sense of increased pelvic pressure or back pain.   A watery or bloody discharge that comes from the vagina.  DIAGNOSIS  A diagnosis can be confirmed by:  A  vaginal exam.   An ultrasound of the cervix.   Sampling (swabbing) cervico-vaginal secretions. These samples can be tested for the presence of fetal fibronectin. This is a protein found in cervical discharge which is associated with preterm labor.   Fetal monitoring.  TREATMENT  Depending on the length of the pregnancy and other circumstances, a caregiver may suggest bed rest. If necessary, there are medicines that can be given to stop contractions and to quicken fetal lung maturity. If labor happens before 34 weeks of pregnancy, a prolonged hospital stay may be recommended. Treatment depends on the condition of both the mother and baby. PREVENTION There are some things a mother can do to lower the risk of preterm labor in future pregnancies. A woman can:   Stop smoking.   Maintain healthy weight gain and avoid chemicals and drugs that are not necessary.   Be watchful for any type of infection.   Inform her caregiver if she has a known history of preterm labor.  Document Released: 11/03/2003 Document Revised: 08/02/2011 Document Reviewed: 12/08/2010 ExitCare Patient Information 2012 [employer].Fetal Movement Counts Patient Name: __________________________________________________ Patient Due Date: ____________________ Kick counts is highly recommended in high risk pregnancies, but it is a good idea for every pregnant woman to do. Start counting fetal movements at 28 weeks of the pregnancy. Fetal movements increase after eating a full meal or eating or drinking something sweet (the blood sugar is higher). It is also important to drink plenty of fluids (well hydrated) before doing the count. Lie   on your left side because it helps with the circulation or you can sit in a comfortable chair with your arms over your belly (abdomen) with no distractions around you. DOING THE COUNT  Try to do the count the same time of day each time you do it.   Mark the day and time, then see how long it  takes for you to feel 10 movements (kicks, flutters, swishes, rolls). You should have at least 10 movements within 2 hours. You will most likely feel 10 movements in much less than 2 hours. If you do not, wait an hour and count again. After a couple of days you will see a pattern.   What you are looking for is a change in the pattern or not enough counts in 2 hours. Is it taking longer in time to reach 10 movements?  SEEK MEDICAL CARE IF:  You feel less than 10 counts in 2 hours. Tried twice.   No movement in one hour.   The pattern is changing or taking longer each day to reach 10 counts in 2 hours.   You feel the baby is not moving as it usually does.  Date: ____________ Movements: ____________ Start time: ____________ Finish time: ____________  Date: ____________ Movements: ____________ Start time: ____________ Finish time: ____________ Date: ____________ Movements: ____________ Start time: ____________ Finish time: ____________ Date: ____________ Movements: ____________ Start time: ____________ Finish time: ____________ Date: ____________ Movements: ____________ Start time: ____________ Finish time: ____________ Date: ____________ Movements: ____________ Start time: ____________ Finish time: ____________ Date: ____________ Movements: ____________ Start time: ____________ Finish time: ____________ Date: ____________ Movements: ____________ Start time: ____________ Finish time: ____________  Date: ____________ Movements: ____________ Start time: ____________ Finish time: ____________ Date: ____________ Movements: ____________ Start time: ____________ Finish time: ____________ Date: ____________ Movements: ____________ Start time: ____________ Finish time: ____________ Date: ____________ Movements: ____________ Start time: ____________ Finish time: ____________ Date: ____________ Movements: ____________ Start time: ____________ Finish time: ____________ Date: ____________ Movements:  ____________ Start time: ____________ Finish time: ____________ Date: ____________ Movements: ____________ Start time: ____________ Finish time: ____________  Date: ____________ Movements: ____________ Start time: ____________ Finish time: ____________ Date: ____________ Movements: ____________ Start time: ____________ Finish time: ____________ Date: ____________ Movements: ____________ Start time: ____________ Finish time: ____________ Date: ____________ Movements: ____________ Start time: ____________ Finish time: ____________ Date: ____________ Movements: ____________ Start time: ____________ Finish time: ____________ Date: ____________ Movements: ____________ Start time: ____________ Finish time: ____________ Date: ____________ Movements: ____________ Start time: ____________ Finish time: ____________  Date: ____________ Movements: ____________ Start time: ____________ Finish time: ____________ Date: ____________ Movements: ____________ Start time: ____________ Finish time: ____________ Date: ____________ Movements: ____________ Start time: ____________ Finish time: ____________ Date: ____________ Movements: ____________ Start time: ____________ Finish time: ____________ Date: ____________ Movements: ____________ Start time: ____________ Finish time: ____________ Date: ____________ Movements: ____________ Start time: ____________ Finish time: ____________ Date: ____________ Movements: ____________ Start time: ____________ Finish time: ____________  Date: ____________ Movements: ____________ Start time: ____________ Finish time: ____________ Date: ____________ Movements: ____________ Start time: ____________ Finish time: ____________ Date: ____________ Movements: ____________ Start time: ____________ Finish time: ____________ Date: ____________ Movements: ____________ Start time: ____________ Finish time: ____________ Date: ____________ Movements: ____________ Start time: ____________ Finish  time: ____________ Date: ____________ Movements: ____________ Start time: ____________ Finish time: ____________ Date: ____________ Movements: ____________ Start time: ____________ Finish time: ____________  Date: ____________ Movements: ____________ Start time: ____________ Finish time: ____________ Date: ____________ Movements: ____________ Start time: ____________ Finish time: ____________ Date: ____________ Movements: ____________ Start time: ____________ Finish time: ____________   Date: ____________ Movements: ____________ Start time: ____________ Finish time: ____________ Date: ____________ Movements: ____________ Start time: ____________ Finish time: ____________ Date: ____________ Movements: ____________ Start time: ____________ Finish time: ____________ Date: ____________ Movements: ____________ Start time: ____________ Finish time: ____________  Date: ____________ Movements: ____________ Start time: ____________ Finish time: ____________ Date: ____________ Movements: ____________ Start time: ____________ Finish time: ____________ Date: ____________ Movements: ____________ Start time: ____________ Finish time: ____________ Date: ____________ Movements: ____________ Start time: ____________ Finish time: ____________ Date: ____________ Movements: ____________ Start time: ____________ Finish time: ____________ Date: ____________ Movements: ____________ Start time: ____________ Finish time: ____________ Date: ____________ Movements: ____________ Start time: ____________ Finish time: ____________  Date: ____________ Movements: ____________ Start time: ____________ Finish time: ____________ Date: ____________ Movements: ____________ Start time: ____________ Finish time: ____________ Date: ____________ Movements: ____________ Start time: ____________ Finish time: ____________ Date: ____________ Movements: ____________ Start time: ____________ Finish time: ____________ Date: ____________  Movements: ____________ Start time: ____________ Finish time: ____________ Date: ____________ Movements: ____________ Start time: ____________ Finish time: ____________ Document Released: 09/12/2006 Document Revised: 08/02/2011 Document Reviewed: 03/15/2009 ExitCare Patient Information 2012 [employer]. 

## 2012-04-04 NOTE — Progress Notes (Signed)
rv'd xray results, WNL, no acute findings. I called pt and left VM that xray was normal, and again recommended R.I.C.E.

## 2012-04-04 NOTE — Progress Notes (Signed)
Pt c/o left foot pain. 

## 2012-04-04 NOTE — Progress Notes (Signed)
[redacted]w[redacted]d L foot still hurting, states it's worse after being on feet at work, FROM, tho pain illicited w downward pressure Pt wearing flip/flops today, but says she wears sneakers at work    Outpatient xray ordered at [facility] Pt instructed R.I.C.E. In the mean time, can use tylenol for pain, instructed to wear sneakers for support all the time Rec: CBE, BF classes, choosing peds rv'd CNM care, pt to try to schedule ROB w CNM's F/u 2ks

## 2012-04-17 ENCOUNTER — Ambulatory Visit (INDEPENDENT_AMBULATORY_CARE_PROVIDER_SITE_OTHER): Payer: Medicaid Other | Admitting: Obstetrics and Gynecology

## 2012-04-17 ENCOUNTER — Encounter: Payer: Self-pay | Admitting: Obstetrics and Gynecology

## 2012-04-17 VITALS — BP 102/62 | Wt 126.0 lb

## 2012-04-17 DIAGNOSIS — Z3400 Encounter for supervision of normal first pregnancy, unspecified trimester: Secondary | ICD-10-CM

## 2012-04-17 DIAGNOSIS — O26849 Uterine size-date discrepancy, unspecified trimester: Secondary | ICD-10-CM

## 2012-04-17 DIAGNOSIS — F329 Major depressive disorder, single episode, unspecified: Secondary | ICD-10-CM

## 2012-04-17 DIAGNOSIS — F419 Anxiety disorder, unspecified: Secondary | ICD-10-CM

## 2012-04-17 DIAGNOSIS — F3289 Other specified depressive episodes: Secondary | ICD-10-CM

## 2012-04-17 DIAGNOSIS — F411 Generalized anxiety disorder: Secondary | ICD-10-CM

## 2012-04-17 NOTE — Progress Notes (Signed)
[redacted]w[redacted]d Pt c/o sharp pain in vaginal area.  States that she is having a lot of bleeding gums when she is brushing her teeth. Dentist appt recommended Also states that she is on her feet 8 hours a day and wants a note states that she needs to take  Breaks to rest her feet. Also states that she is having sharp pains on the right side of back.  NV: Ultrasound for growth because of continued Xanax use (3-4 times weekly per pt)  Gc/chl/GBS.

## 2012-04-25 ENCOUNTER — Ambulatory Visit (INDEPENDENT_AMBULATORY_CARE_PROVIDER_SITE_OTHER): Payer: Medicaid Other | Admitting: Obstetrics and Gynecology

## 2012-04-25 ENCOUNTER — Other Ambulatory Visit: Payer: Self-pay | Admitting: Obstetrics and Gynecology

## 2012-04-25 ENCOUNTER — Ambulatory Visit (INDEPENDENT_AMBULATORY_CARE_PROVIDER_SITE_OTHER): Payer: Medicaid Other

## 2012-04-25 ENCOUNTER — Encounter: Payer: Self-pay | Admitting: Obstetrics and Gynecology

## 2012-04-25 VITALS — BP 112/68 | Wt 126.0 lb

## 2012-04-25 DIAGNOSIS — Z331 Pregnant state, incidental: Secondary | ICD-10-CM

## 2012-04-25 DIAGNOSIS — O26849 Uterine size-date discrepancy, unspecified trimester: Secondary | ICD-10-CM

## 2012-04-25 NOTE — Progress Notes (Signed)
[redacted]w[redacted]d Pt desires cx check.

## 2012-04-25 NOTE — Progress Notes (Signed)
[place] for growth  EFW 6-8 2740 grams  AFI 15.1cm,  vtx with anterior placenta Pt has high anxiety and was very difficult to examine.   GBS GC and chlamydia done. shaperone was present.   Pt with normal growth

## 2012-04-25 NOTE — Patient Instructions (Signed)

## 2012-04-30 LAB — US OB FOLLOW UP

## 2012-05-02 ENCOUNTER — Ambulatory Visit (INDEPENDENT_AMBULATORY_CARE_PROVIDER_SITE_OTHER): Payer: Medicaid Other

## 2012-05-02 VITALS — BP 110/68 | Wt 131.0 lb

## 2012-05-02 DIAGNOSIS — R6252 Short stature (child): Secondary | ICD-10-CM

## 2012-05-02 NOTE — Progress Notes (Signed)
[redacted]w[redacted]d Declined cervical exam.  S.o. At visit.  Multiple labor and hospital questions answered.   Rev'd FKC & labor s/s. Rev'd negative cx's.

## 2012-05-02 NOTE — Progress Notes (Signed)
Pt states no concerns today.   

## 2012-05-04 DIAGNOSIS — R6252 Short stature (child): Secondary | ICD-10-CM | POA: Insufficient documentation

## 2012-05-06 ENCOUNTER — Telehealth: Payer: Self-pay | Admitting: Obstetrics and Gynecology

## 2012-05-06 NOTE — Telephone Encounter (Signed)
TC from pt at 38wks, last few hours has had some ctx about apart. Denies VB, LOF, +FM. rv'd normal labor and FKC, pt has appt Friday

## 2012-05-09 ENCOUNTER — Encounter: Payer: Self-pay | Admitting: Obstetrics and Gynecology

## 2012-05-09 ENCOUNTER — Ambulatory Visit (INDEPENDENT_AMBULATORY_CARE_PROVIDER_SITE_OTHER): Payer: Medicaid Other | Admitting: Obstetrics and Gynecology

## 2012-05-09 VITALS — BP 120/70 | Temp 98.8°F | Wt 130.0 lb

## 2012-05-09 DIAGNOSIS — Z3490 Encounter for supervision of normal pregnancy, unspecified, unspecified trimester: Secondary | ICD-10-CM

## 2012-05-09 DIAGNOSIS — Z331 Pregnant state, incidental: Secondary | ICD-10-CM

## 2012-05-09 NOTE — Progress Notes (Signed)
[redacted]w[redacted]d Patient has flat affect Suffering with anxiety ++ Tearful. States " Stress+++" SVE as per request -  1/50/-3 crying and intolerant of SVE  Offered Counsuelling for anxiety. Taking meds - Xanax for anxiety Fetal Tachycardia today FH 174 pbm NST: Reactive NST,  FM+ Less anxious after NST and reassured. ROB x1

## 2012-05-09 NOTE — Progress Notes (Signed)
Wants cx checked today Pt c/o being under a lot of stress

## 2012-05-16 ENCOUNTER — Encounter: Payer: Self-pay | Admitting: Obstetrics and Gynecology

## 2012-05-16 ENCOUNTER — Ambulatory Visit (INDEPENDENT_AMBULATORY_CARE_PROVIDER_SITE_OTHER): Payer: Medicaid Other | Admitting: Obstetrics and Gynecology

## 2012-05-16 VITALS — BP 128/78 | Wt 132.0 lb

## 2012-05-16 DIAGNOSIS — Z331 Pregnant state, incidental: Secondary | ICD-10-CM

## 2012-05-16 NOTE — Progress Notes (Signed)
Cervix check

## 2012-05-16 NOTE — Progress Notes (Signed)
[redacted]w[redacted]d  GFM More contractions

## 2012-05-23 ENCOUNTER — Encounter: Payer: Self-pay | Admitting: Obstetrics and Gynecology

## 2012-05-23 ENCOUNTER — Ambulatory Visit (INDEPENDENT_AMBULATORY_CARE_PROVIDER_SITE_OTHER): Payer: Medicaid Other | Admitting: Obstetrics and Gynecology

## 2012-05-23 VITALS — BP 110/80 | Wt 131.5 lb

## 2012-05-23 DIAGNOSIS — Z331 Pregnant state, incidental: Secondary | ICD-10-CM

## 2012-05-23 DIAGNOSIS — Z3490 Encounter for supervision of normal pregnancy, unspecified, unspecified trimester: Secondary | ICD-10-CM

## 2012-05-23 NOTE — Progress Notes (Signed)
[redacted]w[redacted]d No complaints. FM+ Vaginal secretions: - mucoid and not watery - possible mucus plug started to separate. Braxton Hicks CTX and no signs of labor FM+ Schedule IOL Tuesday next week at 41 weeks - [facility]

## 2012-05-23 NOTE — Progress Notes (Signed)
Pt does not want cervix checked unless necessary. Pt c/o leaking clear fluid this morning but only a small amount.

## 2012-05-25 ENCOUNTER — Inpatient Hospital Stay (HOSPITAL_COMMUNITY)
Admission: AD | Admit: 2012-05-25 | Discharge: 2012-05-28 | DRG: 774 | Disposition: A | Discharge status: Home / Self Care | Payer: Medicaid Other | Source: Ambulatory Visit | Attending: Obstetrics and Gynecology | Admitting: Obstetrics and Gynecology

## 2012-05-25 ENCOUNTER — Encounter (HOSPITAL_COMMUNITY): Payer: Self-pay | Admitting: Anesthesiology

## 2012-05-25 ENCOUNTER — Encounter (HOSPITAL_COMMUNITY): Payer: Self-pay | Admitting: *Deleted

## 2012-05-25 ENCOUNTER — Telehealth: Payer: Self-pay | Admitting: Obstetrics and Gynecology

## 2012-05-25 ENCOUNTER — Inpatient Hospital Stay (HOSPITAL_COMMUNITY): Payer: Medicaid Other | Admitting: Anesthesiology

## 2012-05-25 DIAGNOSIS — Z8742 Personal history of other diseases of the female genital tract: Secondary | ICD-10-CM

## 2012-05-25 DIAGNOSIS — D649 Anemia, unspecified: Secondary | ICD-10-CM | POA: Diagnosis not present

## 2012-05-25 DIAGNOSIS — F329 Major depressive disorder, single episode, unspecified: Secondary | ICD-10-CM | POA: Diagnosis present

## 2012-05-25 DIAGNOSIS — R6252 Short stature (child): Secondary | ICD-10-CM

## 2012-05-25 DIAGNOSIS — O324XX0 Maternal care for high head at term, not applicable or unspecified: Secondary | ICD-10-CM | POA: Diagnosis present

## 2012-05-25 DIAGNOSIS — O411090 Infection of amniotic sac and membranes, unspecified, unspecified trimester, not applicable or unspecified: Secondary | ICD-10-CM | POA: Diagnosis present

## 2012-05-25 DIAGNOSIS — E611 Iron deficiency: Secondary | ICD-10-CM

## 2012-05-25 DIAGNOSIS — F419 Anxiety disorder, unspecified: Secondary | ICD-10-CM | POA: Diagnosis present

## 2012-05-25 DIAGNOSIS — O9903 Anemia complicating the puerperium: Secondary | ICD-10-CM | POA: Diagnosis not present

## 2012-05-25 DIAGNOSIS — O4690 Antepartum hemorrhage, unspecified, unspecified trimester: Secondary | ICD-10-CM | POA: Diagnosis present

## 2012-05-25 HISTORY — DX: Cocaine abuse, in remission: F14.11

## 2012-05-25 LAB — AMNISURE RUPTURE OF MEMBRANE (ROM) NOT AT ARMC: Amnisure ROM: POSITIVE

## 2012-05-25 LAB — CBC
HCT: 31.2 % — ABNORMAL LOW (ref 36.0–46.0)
MCH: 28.1 pg (ref 26.0–34.0)
MCV: 88.6 fL (ref 78.0–100.0)
Platelets: 207 10*3/uL (ref 150–400)
RBC: 3.52 MIL/uL — ABNORMAL LOW (ref 3.87–5.11)
RDW: 14.3 % (ref 11.5–15.5)
WBC: 9 10*3/uL (ref 4.0–10.5)

## 2012-05-25 MED ORDER — OXYTOCIN 40 UNITS IN LACTATED RINGERS INFUSION - SIMPLE MED: 62.5000 mL/h | Freq: Once | INTRAVENOUS | Status: DC

## 2012-05-25 MED ORDER — LACTATED RINGERS IV SOLN
500.0000 mL | Freq: Once | INTRAVENOUS | Status: AC
  Administered 2012-05-25: 1000 mL via INTRAVENOUS

## 2012-05-25 MED ORDER — LIDOCAINE HCL (PF) 1 % IJ SOLN
INTRAMUSCULAR | Status: DC | PRN
  Administered 2012-05-25 (×2): 5 mL

## 2012-05-25 MED ORDER — ONDANSETRON HCL 4 MG/2ML IJ SOLN: 4.0000 mg | Freq: Four times a day (QID) | INTRAMUSCULAR | Status: DC | PRN

## 2012-05-25 MED ORDER — BUTORPHANOL TARTRATE 1 MG/ML IJ SOLN
2.0000 mg | INTRAMUSCULAR | Status: DC | PRN
  Administered 2012-05-25: 2 mg via INTRAVENOUS
  Filled 2012-05-25: qty 2

## 2012-05-25 MED ORDER — ACETAMINOPHEN 325 MG PO TABS: 650.0000 mg | ORAL_TABLET | ORAL | Status: DC | PRN

## 2012-05-25 MED ORDER — LACTATED RINGERS IV SOLN
INTRAVENOUS | Status: DC
  Administered 2012-05-25 – 2012-05-26 (×4): via INTRAVENOUS

## 2012-05-25 MED ORDER — OXYTOCIN BOLUS FROM INFUSION
500.0000 mL | Freq: Once | INTRAVENOUS | Status: AC
  Administered 2012-05-26: 500 mL via INTRAVENOUS
  Filled 2012-05-25: qty 500

## 2012-05-25 MED ORDER — DIPHENHYDRAMINE HCL 50 MG/ML IJ SOLN: 12.5000 mg | INTRAMUSCULAR | Status: DC | PRN

## 2012-05-25 MED ORDER — IBUPROFEN 600 MG PO TABS
600.0000 mg | ORAL_TABLET | Freq: Four times a day (QID) | ORAL | Status: DC | PRN
  Administered 2012-05-26: 600 mg via ORAL
  Filled 2012-05-25: qty 1

## 2012-05-25 MED ORDER — PHENYLEPHRINE 40 MCG/ML (10ML) SYRINGE FOR IV PUSH (FOR BLOOD PRESSURE SUPPORT)
80.0000 ug | PREFILLED_SYRINGE | INTRAVENOUS | Status: DC | PRN
  Filled 2012-05-25 (×2): qty 5

## 2012-05-25 MED ORDER — BUTORPHANOL TARTRATE 1 MG/ML IJ SOLN
1.0000 mg | INTRAMUSCULAR | Status: DC | PRN
  Administered 2012-05-25: 1 mg via INTRAVENOUS
  Filled 2012-05-25 (×2): qty 1

## 2012-05-25 MED ORDER — PROMETHAZINE HCL 25 MG/ML IJ SOLN
12.5000 mg | INTRAMUSCULAR | Status: DC | PRN
  Administered 2012-05-25: 12.5 mg via INTRAVENOUS
  Filled 2012-05-25: qty 1

## 2012-05-25 MED ORDER — EPHEDRINE 5 MG/ML INJ
10.0000 mg | INTRAVENOUS | Status: DC | PRN
  Filled 2012-05-25 (×2): qty 4

## 2012-05-25 MED ORDER — CITRIC ACID-SODIUM CITRATE 334-500 MG/5ML PO SOLN
30.0000 mL | ORAL | Status: DC | PRN
  Filled 2012-05-25: qty 15

## 2012-05-25 MED ORDER — FENTANYL 2.5 MCG/ML BUPIVACAINE 1/10 % EPIDURAL INFUSION (WH - ANES)
14.0000 mL/h | INTRAMUSCULAR | Status: DC
  Administered 2012-05-25: 14 mL/h via EPIDURAL
  Administered 2012-05-25: 12 mL/h via EPIDURAL
  Administered 2012-05-25 – 2012-05-26 (×8): 14 mL/h via EPIDURAL
  Filled 2012-05-25 (×11): qty 60

## 2012-05-25 MED ORDER — OXYCODONE-ACETAMINOPHEN 5-325 MG PO TABS
1.0000 | ORAL_TABLET | ORAL | Status: DC | PRN
  Administered 2012-05-26: 1 via ORAL
  Filled 2012-05-25: qty 1

## 2012-05-25 MED ORDER — PHENYLEPHRINE 40 MCG/ML (10ML) SYRINGE FOR IV PUSH (FOR BLOOD PRESSURE SUPPORT): 80.0000 ug | PREFILLED_SYRINGE | INTRAVENOUS | Status: DC | PRN

## 2012-05-25 MED ORDER — OXYTOCIN 40 UNITS IN LACTATED RINGERS INFUSION - SIMPLE MED
1.0000 m[IU]/min | INTRAVENOUS | Status: DC
  Administered 2012-05-25: 1 m[IU]/min via INTRAVENOUS
  Filled 2012-05-25: qty 1000

## 2012-05-25 MED ORDER — HYDROXYZINE HCL 50 MG PO TABS
100.0000 mg | ORAL_TABLET | Freq: Three times a day (TID) | ORAL | Status: DC
  Administered 2012-05-25 – 2012-05-26 (×5): 100 mg via ORAL
  Filled 2012-05-25 (×4): qty 2
  Filled 2012-05-25: qty 1
  Filled 2012-05-25 (×4): qty 2

## 2012-05-25 MED ORDER — LACTATED RINGERS IV SOLN
500.0000 mL | INTRAVENOUS | Status: DC | PRN
  Administered 2012-05-26 (×2): 300 mL via INTRAVENOUS

## 2012-05-25 MED ORDER — LIDOCAINE HCL (PF) 1 % IJ SOLN
30.0000 mL | INTRAMUSCULAR | Status: DC | PRN
  Administered 2012-05-26: 30 mL via SUBCUTANEOUS
  Filled 2012-05-25: qty 30

## 2012-05-25 MED ORDER — EPHEDRINE 5 MG/ML INJ: 10.0000 mg | INTRAVENOUS | Status: DC | PRN

## 2012-05-25 MED ORDER — TERBUTALINE SULFATE 1 MG/ML IJ SOLN: 0.2500 mg | Freq: Once | INTRAMUSCULAR | Status: AC | PRN

## 2012-05-25 NOTE — MAU Note (Signed)
Water broke 0445. Contractions since 1:30am. Every 5 mins

## 2012-05-25 NOTE — Progress Notes (Signed)
  Subjective: Sporadic pressure on right side--currently positioned on that side and receiving PCA boluses  Objective: BP 124/63  Pulse 85  Temp 98.7 F (37.1 C) (Oral)  Resp 20  Ht 4\' 9"  (1.448 m)  Wt 131 lb (59.421 kg)  BMI 28.35 kg/m2  SpO2 98%  LMP 07/28/2011   Total I/O In: -  Out: 1475 [Urine:1475]  FHT:  Category 1 UC:   q 2-4 min Pitocin on 4 mu/min--had tachysystole when on 6 mu/min.   Assessment / Plan: Will continue to augment at present VE deferred at present  Nigel Bridgeman 05/25/2012, 6:41 PM

## 2012-05-25 NOTE — Progress Notes (Signed)
  Subjective: Still has some awareness of contractions--somewhat difficult to determine if it is really a sensation of pain vs anxiety response to pressure sensation.    Objective: BP 125/76  Pulse 85  Temp 97.9 F (36.6 C) (Oral)  Resp 20  Ht 4\' 9"  (1.448 m)  Wt 131 lb (59.421 kg)  BMI 28.35 kg/m2  SpO2 98%  LMP 07/28/2011      FHT: Category 1 UC:   irregular, every 2-5 minutes SVE:   2, posterior, 80%, vtx, -2--patient still uncomfortable with VE, but tolerated.  Deferred IUPC placement at present due to patient anxiety/discomfort Leaking light MSF.   Assessment / Plan: Inadequate labor, s/p SROM at 4:50am. Will start pitocin augmentation Epidural PCA as needed.   Nigel Bridgeman 05/25/2012, 1:35 PM

## 2012-05-25 NOTE — Progress Notes (Signed)
Late entry: 2330 Continues to c/o of back pain has only tolerated low fowlers, c/o of R side and low backache pain level 7, c/o of pain with pressure applied to low back with contraction O VSS      fhts category 1      abd soft between uc      Contractions adequate x 1 1/2 hours      Vag 6 100 0 no capit or molding      Alcohol swab to assess level of anesthesia done on back and abdomen 3 inches       above symphysis  A with descent     Adequate MVU 1 1/2 hour     Inadequate epidural Pc/o of inability to assist with position change and moves arms and shoulders with much encouragement, assisted to squat bar, epidural dosing x 3 then call to anesthesia if inadequate pain relief, discussed techniques for pushing at delivery with active participation, continue care Lavera Guise, CNM

## 2012-05-25 NOTE — Anesthesia Procedure Notes (Signed)
Epidural Patient location during procedure: OB Start time: 05/25/2012 11:46 AM  Staffing Anesthesiologist: Brayton Caves R Performed by: anesthesiologist   Preanesthetic Checklist Completed: patient identified, site marked, surgical consent, pre-op evaluation, timeout performed, IV checked, risks and benefits discussed and monitors and equipment checked  Epidural Patient position: sitting Prep: site prepped and draped and DuraPrep Patient monitoring: continuous pulse ox and blood pressure Approach: midline Injection technique: LOR air and LOR saline  Needle:  Needle type: Tuohy  Needle gauge: 17 G Needle length: 9 cm and 9 Needle insertion depth: 4 cm Catheter type: closed end flexible Catheter size: 19 Gauge Catheter at skin depth: 10 cm Test dose: negative  Assessment Events: blood not aspirated, injection not painful, no injection resistance, negative IV test and no paresthesia  Additional Notes Patient identified.  Risk benefits discussed including failed block, incomplete pain control, headache, nerve damage, paralysis, blood pressure changes, nausea, vomiting, reactions to medication both toxic or allergic, and postpartum back pain.  Patient expressed understanding and wished to proceed.  All questions were answered.  Sterile technique used throughout procedure and epidural site dressed with sterile barrier dressing. No paresthesia or other complications noted.The patient did not experience any signs of intravascular injection such as tinnitus or metallic taste in mouth nor signs of intrathecal spread such as rapid motor block. Please see nursing notes for vital signs.

## 2012-05-25 NOTE — Progress Notes (Signed)
Breathing with contractions, c/o of low backache all across, "states that epidural is giving some relief" Lavera Guise, CNM

## 2012-05-25 NOTE — H&P (Signed)
Kristi Perkins is a 26 y.o. female presenting for vaginal bleeding at 0130 unable to describe as mucusy, drippy or bright red, with srom, with +fm. Dating by LMP and 13 week [place] agrees with dates. History OB History    Grav Para Term Preterm Abortions TAB SAB Ect Mult Living   2 0 0 0 1 1 0 0 0 0     hx of eab Past Medical History  Diagnosis Date  . Depression   . Hernia   . Anxiety   . FH: anemia     Father  . FH: Parkinson's disease     Father  . UTI (urinary tract infection)      x1  . H/O varicella   . FH: heart disease   . FH: diabetes mellitus   . FH: migraines   . FH: kidney disease   . FH: cancer   . FH: liver disease   . Low iron   . Hx of ovarian cyst    History reviewed. No pertinent past surgical history. Family History: family history includes Alcohol abuse in her father; Cancer in her maternal grandmother; Depression in her father; Diabetes in her father; Heart disease in her father; Hypertension in her father; and Kidney disease in her father and paternal grandfather. Social History:  reports that she has quit smoking. She has never used smokeless tobacco. She reports that she does not drink alcohol or use illicit drugs.    Medication List     As of 05/25/2012  6:49 AM    ASK your doctor about these medications         ALPRAZolam 0.5 MG tablet   Commonly known as: XANAX   Take 0.5 mg by mouth 3 (three) times daily as needed. For anxiety      BENADRYL ALLERGY PO   Take 25 mg by mouth as needed. Pt takes med for sleep      PRENATAL VITAMINS PO   Take 1 tablet by mouth daily.          Prenatal Transfer Tool  Maternal Diabetes: No Genetic Screening: Declined Maternal Ultrasounds/Referrals: Normal 9/04 6#^oz growth 55% Fetal Ultrasounds or other Referrals:  None Maternal Substance Abuse:  Yes:  Type: Other: [place] xanax 0.5 mg daily prn anxiety Significant Maternal Medications:  Meds include: Other:  Significant Maternal Lab Results:  None Other  Comments:  using xanax with pregnancy states 0.5 mg prn daily  ROS    Blood pressure 124/88, pulse 89, temperature 98.5 F (36.9 C), temperature source Oral, resp. rate 20, last menstrual period 07/28/2011. Exam Physical Exam Calm, no distress, HEENT WNL grossly, lungs clear bilaterally, AP RRR, abd soft nt,no masses, not tympanic bowel sounds active, abdomen nontender, No edema to lower extremities Vertex per leopolds fhts category 1 uc q 2-6 mild Vag by RN 2 cm outter os difficult exam due to pt intolerance, no blood with exam and no blood on perineum or pad amnisure + Wheel chair pads wet on arrival  Prenatal labs: ABO, Rh: A/Positive/-- (03/12 0000) Antibody: Negative (03/12 0000) Rubella: Immune (03/12 0000) RPR: NON REAC (06/28 1057)  HBsAg: Negative (03/12 0000)  HIV: Non-reactive (03/12 0000)  GBS: NEGATIVE (08/30 1508)  GC/CHL  Neg/neg  Assessment/Plan: [redacted]w[redacted]d GBS neg SROM thin meconium Routine admission, discussed options for pain relief, collaboration with Dr. Pennie Rushing.  Ardel Jagger, [facility] 05/25/2012, 6:36 AM

## 2012-05-25 NOTE — Progress Notes (Signed)
  Subjective: Received pain medication at 7am, sleepy still.   FOB at bedside, supportive.  Objective: BP 121/71  Pulse 88  Temp 98 F (36.7 C) (Oral)  Resp 20  Ht 4\' 9"  (1.448 m)  Wt 131 lb (59.421 kg)  BMI 28.35 kg/m2  LMP 07/28/2011      FHT: Category 1 UC:   Irregular, q 4-6 min. SVE:   Dilation: 1.5 (per sve in MAU before admission.) VE deferred   Assessment / Plan: SROM at 4:45am Early labor GBS negative Difficulty with pelvic exams  Plan: Recommend epidural as soon as patient ready Plan VE after epidural Augment prn.  Nigel Bridgeman 05/25/2012, 7:40 AM

## 2012-05-25 NOTE — Progress Notes (Signed)
  Subjective: Sleeping soundly.  Partner at bedside.  Objective: BP 116/54  Pulse 76  Temp 98.1 F (36.7 C) (Oral)  Resp 20  Ht 4\' 9"  (1.448 m)  Wt 131 lb (59.421 kg)  BMI 28.35 kg/m2  SpO2 98%  LMP 07/28/2011   Total I/O In: -  Out: 500 [Urine:500]  FHT:  Category 1 UC:   q 2-3 min Pitocin on 5 mu/min (started at 2pm) VE deferred at present.   Assessment / Plan: SROM, on pitocin for augmentation  Trayden Brandy 05/25/2012, 4:30 PM

## 2012-05-25 NOTE — Telephone Encounter (Signed)
Entered in error

## 2012-05-25 NOTE — Anesthesia Preprocedure Evaluation (Signed)
Anesthesia Evaluation  Patient identified by MRN, date of birth, ID band Patient awake    Reviewed: Allergy & Precautions, H&P , Patient's Chart, lab work & pertinent test results  Airway Mallampati: II TM Distance: >3 FB Neck ROM: full    Dental No notable dental hx.    Pulmonary neg pulmonary ROS,  breath sounds clear to auscultation  Pulmonary exam normal       Cardiovascular negative cardio ROS  Rhythm:regular Rate:Normal     Neuro/Psych negative neurological ROS  negative psych ROS   GI/Hepatic negative GI ROS, Neg liver ROS,   Endo/Other  negative endocrine ROS  Renal/GU negative Renal ROS     Musculoskeletal   Abdominal   Peds  Hematology negative hematology ROS (+)   Anesthesia Other Findings Depression     Hernia        Anxiety    Reproductive/Obstetrics (+) Pregnancy                           Anesthesia Physical Anesthesia Plan  ASA: II  Anesthesia Plan: Epidural   Post-op Pain Management:    Induction:   Airway Management Planned:   Additional Equipment:   Intra-op Plan:   Post-operative Plan:   Informed Consent: I have reviewed the patients History and Physical, chart, labs and discussed the procedure including the risks, benefits and alternatives for the proposed anesthesia with the patient or authorized representative who has indicated his/her understanding and acceptance.     Plan Discussed with:   Anesthesia Plan Comments:         Anesthesia Quick Evaluation

## 2012-05-25 NOTE — Progress Notes (Signed)
  Subjective: Epidural just placed--patient more comfortable, still feels anxious.  Has used Xanax every day during pregnancy.  Objective: BP 128/60  Pulse 74  Temp 97.2 F (36.2 C) (Axillary)  Resp 18  Ht 4\' 9"  (1.448 m)  Wt 131 lb (59.421 kg)  BMI 28.35 kg/m2  LMP 07/28/2011      FHT: Category 1 UC:   irregular, every 2-4 minutes VE deferred at present   Assessment / Plan: SROM at 4:50am, early labor GBS negative Intolerance to pelvic exams Plan cervical check when epidural fully effective. Anticipate augmentation then. Will give Vistaril for anxiety per patient request  Nigel Bridgeman 05/25/2012, 12:11 PM

## 2012-05-25 NOTE — Progress Notes (Signed)
Comfortable, some pain pressure in back O VSS      fhts 130s LTV mod      abd soft between uc      Contractions q 2 25 mmHg      Vag 5 100 -1/0 VTX R A active labor P IUP placed easily with pt consent also discussed adequacy of uc with monitoring and amnioinfusion if indicated, verbalized understanding, continue care Lavera Guise, CNM

## 2012-05-26 ENCOUNTER — Encounter (HOSPITAL_COMMUNITY): Payer: Self-pay | Admitting: Obstetrics and Gynecology

## 2012-05-26 LAB — ABO/RH: ABO/RH(D): A POS

## 2012-05-26 MED ORDER — WITCH HAZEL-GLYCERIN EX PADS: 1.0000 "application " | MEDICATED_PAD | CUTANEOUS | Status: DC | PRN

## 2012-05-26 MED ORDER — LANOLIN HYDROUS EX OINT: TOPICAL_OINTMENT | CUTANEOUS | Status: DC | PRN

## 2012-05-26 MED ORDER — IBUPROFEN 600 MG PO TABS
600.0000 mg | ORAL_TABLET | Freq: Four times a day (QID) | ORAL | Status: DC
  Administered 2012-05-26 – 2012-05-28 (×6): 600 mg via ORAL
  Filled 2012-05-26 (×6): qty 1

## 2012-05-26 MED ORDER — TETANUS-DIPHTH-ACELL PERTUSSIS 5-2.5-18.5 LF-MCG/0.5 IM SUSP: 0.5000 mL | Freq: Once | INTRAMUSCULAR | Status: DC

## 2012-05-26 MED ORDER — PRENATAL MULTIVITAMIN CH
1.0000 | ORAL_TABLET | Freq: Every day | ORAL | Status: DC
  Administered 2012-05-27 – 2012-05-28 (×2): 1 via ORAL
  Filled 2012-05-26 (×2): qty 1

## 2012-05-26 MED ORDER — SODIUM BICARBONATE 8.4 % IV SOLN
INTRAVENOUS | Status: DC | PRN
  Administered 2012-05-26: 5 mL via EPIDURAL

## 2012-05-26 MED ORDER — DIPHENHYDRAMINE HCL 25 MG PO CAPS: 25.0000 mg | ORAL_CAPSULE | Freq: Four times a day (QID) | ORAL | Status: DC | PRN

## 2012-05-26 MED ORDER — ZOLPIDEM TARTRATE 5 MG PO TABS: 5.0000 mg | ORAL_TABLET | Freq: Every evening | ORAL | Status: DC | PRN

## 2012-05-26 MED ORDER — ONDANSETRON HCL 4 MG PO TABS: 4.0000 mg | ORAL_TABLET | ORAL | Status: DC | PRN

## 2012-05-26 MED ORDER — MEASLES, MUMPS & RUBELLA VAC ~~LOC~~ INJ: 0.5000 mL | INJECTION | Freq: Once | SUBCUTANEOUS | Status: DC

## 2012-05-26 MED ORDER — OXYCODONE-ACETAMINOPHEN 5-325 MG PO TABS
1.0000 | ORAL_TABLET | ORAL | Status: DC | PRN
  Administered 2012-05-27 (×5): 2 via ORAL
  Administered 2012-05-28: 1 via ORAL
  Administered 2012-05-28: 2 via ORAL
  Filled 2012-05-26 (×6): qty 2
  Filled 2012-05-26: qty 1

## 2012-05-26 MED ORDER — HYDROXYZINE HCL 50 MG PO TABS
50.0000 mg | ORAL_TABLET | ORAL | Status: DC | PRN
  Administered 2012-05-26: 50 mg via ORAL
  Filled 2012-05-26: qty 1

## 2012-05-26 MED ORDER — SENNOSIDES-DOCUSATE SODIUM 8.6-50 MG PO TABS
2.0000 | ORAL_TABLET | Freq: Every day | ORAL | Status: DC
  Administered 2012-05-26 – 2012-05-27 (×2): 2 via ORAL

## 2012-05-26 MED ORDER — HYDROXYZINE HCL 50 MG PO TABS
50.0000 mg | ORAL_TABLET | Freq: Four times a day (QID) | ORAL | Status: DC | PRN
  Filled 2012-05-26: qty 1

## 2012-05-26 MED ORDER — OXYTOCIN 40 UNITS IN LACTATED RINGERS INFUSION - SIMPLE MED
1.0000 m[IU]/min | INTRAVENOUS | Status: DC
  Administered 2012-05-26: 13 m[IU]/min via INTRAVENOUS

## 2012-05-26 MED ORDER — ACETAMINOPHEN 500 MG PO TABS
1000.0000 mg | ORAL_TABLET | ORAL | Status: DC | PRN
  Administered 2012-05-26: 1000 mg via ORAL
  Filled 2012-05-26: qty 2

## 2012-05-26 MED ORDER — ONDANSETRON HCL 4 MG/2ML IJ SOLN: 4.0000 mg | INTRAMUSCULAR | Status: DC | PRN

## 2012-05-26 MED ORDER — SODIUM CHLORIDE 0.9 % IV SOLN
3.0000 g | Freq: Four times a day (QID) | INTRAVENOUS | Status: DC
  Administered 2012-05-26 (×2): 3 g via INTRAVENOUS
  Filled 2012-05-26 (×4): qty 3

## 2012-05-26 MED ORDER — DIBUCAINE 1 % RE OINT: 1.0000 "application " | TOPICAL_OINTMENT | RECTAL | Status: DC | PRN

## 2012-05-26 MED ORDER — SIMETHICONE 80 MG PO CHEW: 80.0000 mg | CHEWABLE_TABLET | ORAL | Status: DC | PRN

## 2012-05-26 MED ORDER — BENZOCAINE-MENTHOL 20-0.5 % EX AERO
1.0000 "application " | INHALATION_SPRAY | CUTANEOUS | Status: DC | PRN
  Filled 2012-05-26 (×2): qty 56

## 2012-05-26 NOTE — Progress Notes (Signed)
C/O pain, epidural bolus per program.

## 2012-05-26 NOTE — Progress Notes (Signed)
Called nurse anes for epidural bolus.

## 2012-05-26 NOTE — Progress Notes (Signed)
Patient ID: Kristi Perkins, female   DOB: 06/12/1986, 26 y.o.   MRN: [id number] .Subjective: Pt got pain relief w epidural redose, now having more pain on L lower side, denies urge to push   Objective: BP 114/70  Pulse 110  Temp 99.5 F (37.5 C) (Axillary)  Resp 20  Ht 4\' 9"  (1.448 m)  Wt 131 lb (59.421 kg)  BMI 28.35 kg/m2  SpO2 98%  LMP 07/28/2011   FHT:  FHR: 130 bpm, variability: moderate,  accelerations:  Present,  decelerations:  Present occ variable UC:   irregular, every 3-5 minutes SVE:   Dilation: 10 Effacement (%): 100 Station: +1 Exam by:: S. Nira Visscher CNM  MVU's 100  Assessment / Plan: 2nd stage Ctx now adequate since pitocin was restarted Pt does not tolerate VE, states it is too painful and does not relax legs Discussed w pt, may need to do c/s if she's unable to push Pt turned to L side and epidural PCA utilized  Will recheck in 20 min   Fetal Wellbeing:  Category I Pain Control:  Epidural  Update physician PRN  Malissa Hippo 05/26/2012, 1:28 PM

## 2012-05-26 NOTE — Progress Notes (Signed)
Patient ID: Kristi Perkins, female   DOB: [birth year], 26 y.o.   MRN: [id number] .Subjective: Pt continues to c/o pain, sometimes in back sometimes in front Does not tolerate position changes, attempt to put legs in stirrups and pt could not cooperate, attempt to check fetal position and station and pt did not tolerate Has used epidural PCA, but doesn't appear to be working Anesthesia notified    Objective: BP 117/66  Pulse 105  Temp 98.9 F (37.2 C) (Axillary)  Resp 24  Ht 4\' 9"  (1.448 m)  Wt 131 lb (59.421 kg)  BMI 28.35 kg/m2  SpO2 98%  LMP 07/28/2011   FHT:  FHR: 130 bpm, variability: moderate,  accelerations:  Abscent,  decelerations:  Absent UC:   regular, every 3-4 minutes SVE:   Dilation: 10 Effacement (%): 100 Station: +1 Exam by:: Tully Burgo, CNM  Pitocin on 3mu,   Assessment / Plan: continue labor down Will titrate pitocin for adequacy  Anesthesia to evaluate epidural D/w Dr Stefano Gaul, if pt unable to push, consider recommending C/S     Fetal Wellbeing:  Category I Pain Control:  Epidural  Update physician PRN  Zaydin Billey M 05/26/2012, 11:11 AM

## 2012-05-26 NOTE — Progress Notes (Signed)
Patient ID: Kristi Perkins, female   DOB: [birth year], 26 y.o.   MRN: [id number] .Subjective:  Pt c/o increased pain, very anxious and having a hard time relaxing, family remains at [facility] No urge to push, has pain when assisted to move her legs, even tho they are numb   Objective: BP 139/93  Pulse 113  Temp 99.3 F (37.4 C) (Axillary)  Resp 20  Ht 4\' 9"  (1.448 m)  Wt 131 lb (59.421 kg)  BMI 28.35 kg/m2  SpO2 98%  LMP 07/28/2011   FHT:  FHR: 165 bpm, variability: moderate,  accelerations:  Present,  decelerations:  Present occ mild variables UC:   regular, every 1 minutes, run of tachysystole  SVE:   Dilation: 10 Effacement (%): 100 Station: +1 Exam by:: Kristi Perkins, CNM  Pitocin was turned off, will restart 30 min after my vag exam  Assessment / Plan: 2nd stage, pt does not have urge to push Kristi Kristi Perkins here to redose epidural and replace connection where it has apparently come loose    Will also give dose of vistaril  Allow for laboring down   Fetal Wellbeing:  Category II Pain Control:  Epidural  Kristi Perkins updated   Kristi Perkins 05/26/2012, 10:15 AM (late entry at 0830)

## 2012-05-26 NOTE — Progress Notes (Signed)
C/O pain, 2nd bolus started.

## 2012-05-26 NOTE — Progress Notes (Signed)
Nurse Anes administered epidural bolus for pain relief

## 2012-05-26 NOTE — Progress Notes (Signed)
Patient ID: Kristi Perkins, female   DOB: [birth year], 26 y.o.   MRN: [id number] .Subjective:  Pt had been resting after epidural redose, now c/o pain in lower abd, seems to be worse w ctx, also c/o back pain, and pain at hernia site  Stating "I can't, I can't"   Objective: BP 149/81  Pulse 109  Temp 99.5 F (37.5 C) (Axillary)  Resp 20  Ht 4\' 9"  (1.448 m)  Wt 131 lb (59.421 kg)  BMI 28.35 kg/m2  SpO2 98%  LMP 07/28/2011   FHT:  FHR: 130 bpm, variability: moderate,  accelerations:  Present,  decelerations:  Present early variables UC:   regular, every 3-7 minutes SVE:   Dilation: 10 Effacement (%): 100 Station: +1 Exam by:: S. Janiyah Beery CNM    Assessment / Plan: Arrest of decent Pt intolerant of position changes and vag exams w vag exam pt says "stop" Attempt to have pt push, in various positions, but states she can't Offered pt c/s, explained risks: including but not limited to: bleeding, infection, anesthesia complications, injury to internal organs. Pt states "I don't know"      Fetal Wellbeing:  Category II Pain Control:  Epidural  Update physician PRN  Malissa Hippo 05/26/2012, 2:23 PM

## 2012-05-26 NOTE — Progress Notes (Signed)
Comfortable but able to feel contractions O VSS      fhts 120 LTV min to mod early decel at times      abd soft between uc q2-3      Contractions 190 MVU past hour, over 200 prior      Vag 7 100 0 VTX R clear no meconium seen A [redacted]w[redacted]d      Active labor     SROM x 21 hours P repositioned to R sims position, continue care Kristi Perkins, CNM

## 2012-05-26 NOTE — Progress Notes (Signed)
Asleep awakens "no pain" now moans and grimace with uc O VSS      fhts category 1      abd soft between uc      EFW 7#8      Contractions MVU 200      Vag 9  100 +1 scant pink/clear fluid A active labor P reviewed plan to labor down, no meconium seen this shift,continue care Lavera Guise, CNM

## 2012-05-27 ENCOUNTER — Inpatient Hospital Stay (HOSPITAL_COMMUNITY): Admission: RE | Admit: 2012-05-27 | Payer: Medicaid Other | Source: Ambulatory Visit

## 2012-05-27 ENCOUNTER — Encounter (HOSPITAL_COMMUNITY): Payer: Self-pay | Admitting: Obstetrics and Gynecology

## 2012-05-27 LAB — CBC
HCT: 23.8 % — ABNORMAL LOW (ref 36.0–46.0)
Hemoglobin: 7.6 g/dL — ABNORMAL LOW (ref 12.0–15.0)
MCHC: 31.9 g/dL (ref 30.0–36.0)
MCV: 87.2 fL (ref 78.0–100.0)

## 2012-05-27 MED ORDER — LACTATED RINGERS IV BOLUS (SEPSIS)
500.0000 mL | Freq: Once | INTRAVENOUS | Status: AC
  Administered 2012-05-27: 500 mL via INTRAVENOUS

## 2012-05-27 NOTE — Progress Notes (Addendum)
Post Partum Day 1 Subjective: no complaints, up ad lib without syncope, voiding, tolerating PO, + flatus  Pain well controlled with po meds BF baby latching well. Mother finding the experience of latching and sucking challenging. Lactation Consultant at bedside. Patient needs encouragement as pain threshold low. Mood stable, bonding well Contraception:  Will discuss later today when the patient is more rested.   Objective: Blood pressure 117/74, pulse 98, temperature 98.4 F (36.9 C), temperature source Oral, resp. rate 20, height 4\' 9"  (1.448 m), weight 131 lb (59.421 kg), last menstrual period 07/28/2011, SpO2 98.00%, unknown if currently breastfeeding.  Physical Exam:  General: alert, cooperative, appears stated age and mild distress due to anxiety issues that were established prior to delivery.  Lungs: CTAB Heart: RRR Breasts: patient reports that her nipples are tender and the Lactation Consultant is helping the patient with nipple care Lochia: appropriate Uterine Fundus: firm Perineum: DVT Evaluation: No evidence of DVT seen on physical exam. Negative Homan's sign. Calf/Ankle edema is present.   Basename 05/27/12 0530 05/25/12 0618  HGB 7.6* 9.9*  HCT 23.8* 31.2*   Severe postpartum anemia.  Orthostatic vital signs.  To commence on po Iron ( Ferrous Sulfate 325 mgs po BID with stool softener)  Patient has not been mobilizing and is reluctant to void. Had in/out catheterization last night and the nursing personnel  will get her out to void in toilet. The patient has a 2nd degree laceration. The nursing personnel will contact [facility] to   update on progress.  The patient was unable to void and was unwilling to get out of bed to void. Bladder scan with volume of over . IV LR bolus and within 2 hours was able to void 900 mls in one void. Now ambulating better and out to bathroom to void. Still complains of pain and says she is unable to care for the baby except for feeding  cause of pain. SW consult today. Patient has history of cocaine abuse in her teens and suffers from depression and anxiety. Observe for signs of postpartum depression. This subject needs to be discussed with the patient prior to discharge to home tomorrow. Also birth control needs to be reviewed.  Assessment/Plan: Plan for discharge tomorrow and Contraception to be discussed later today.      LOS: 2 days   Kristi Perkins 05/27/2012, 9:19 AM

## 2012-05-27 NOTE — Clinical Social Work Maternal (Signed)
    Clinical Social Work Department PSYCHOSOCIAL ASSESSMENT - MATERNAL/CHILD 05/27/2012  Patient:  Kristi Perkins, Kristi Perkins  Account Number:  [id number]  Admit Date:  05/25/2012  Kristi Perkins Name:   Kristi Perkins    Clinical Social Worker:  Andy Gauss   Date/Time:  05/27/2012 11:16 AM  Date Referred:  05/27/2012   Referral source  CN     Referred reason  Depression/Anxiety  Substance Abuse   Other referral source:    I:  FAMILY / HOME ENVIRONMENT Child's legal guardian:  PARENT  Guardian - Name Guardian - Age Guardian - Address  Kristi Perkins [address] [place], [place] [address]  Kristi Perkins 29 (same as above)   Other household support members/support persons Other support:   Kristi Perkins, mom    II  PSYCHOSOCIAL DATA Information Source:  Patient Interview  Financial and Community Resources Employment:   Financial resources:  [employer] If Medicaid - County:  [place] Other  [facility]   Perkins / Grade:   Maternity Care Coordinator / Child Services Coordination / Early Interventions:  Cultural issues impacting care:    III  STRENGTHS Strengths  Adequate Resources  Home prepared for Child (including basic supplies)  Supportive family/friends   Strength comment:    IV  RISK FACTORS AND CURRENT PROBLEMS Current Problem:  YES   Risk Factor & Current Problem Patient Issue Family Issue Risk Factor / Current Problem Comment  Mental Illness Y N Hx of depression/anxiety  Substance Abuse Y N Hx of cocaine and etoh use    V  SOCIAL WORK ASSESSMENT Sw referral received to assess pt's history of depression/anxiety and substance use hx.  Pt was diagnosed with depression at 26 years old.  MD at [facility], diagnosed pt with anxiety disorder in 2011.  She was prescribed Xanax .5, 3 times a day.  According to pt, she limited use to, 3-5 times a week.  She took the medication throughout the entire pregnancy.  Pt told Sw that her family causes her  anxiety but did not elaborate.  According to pt, the medication is helpful.  Pt also participated in counseling, a couple of years ago.  Pt has a history of cocaine use during her teenage years ([age] years old).  She denies any use since then.  She reports having all the necessary supplies for the infant and good family support.  She told Sw that she is unable to care for baby (except feedings) due to pain.  FOB is providing all baby care.  Sw encouraged her get up and walk, so that she can start to feel better and bond with infant.  Sw available to assist pt further until discharge if needed.      VI SOCIAL WORK PLAN Social Work Plan  No Further Intervention Required / No Barriers to Discharge   Type of pt/family education:   If child protective services report - county:   If child protective services report - date:   Information/referral to community resources comment:   Other social work plan:

## 2012-05-27 NOTE — Anesthesia Postprocedure Evaluation (Signed)
  Anesthesia Post-op Note  Patient: Kristi Perkins  Procedure(s) Performed: * No procedures listed *  Patient Location: Mother/Baby  Anesthesia Type: Epidural  Level of Consciousness: awake, alert  and oriented  Airway and Oxygen Therapy: Patient Spontanous Breathing  Post-op Pain: none  Post-op Assessment: Patient's Cardiovascular Status Stable, Respiratory Function Stable, Patent Airway, No signs of Nausea or vomiting, Adequate PO intake, Pain level controlled, No headache, No backache, No residual numbness and No residual motor weakness  Post-op Vital Signs: Reviewed and stable  Complications: No apparent anesthesia complications

## 2012-05-27 NOTE — Progress Notes (Signed)
UR chart review completed.  

## 2012-05-28 LAB — TYPE AND SCREEN
ABO/RH(D): A POS
Unit division: 0

## 2012-05-28 MED ORDER — SENNOSIDES-DOCUSATE SODIUM 8.6-50 MG PO TABS: 2.0000 | ORAL_TABLET | Freq: Every day | ORAL | Status: DC

## 2012-05-28 MED ORDER — FERROUS SULFATE 325 (65 FE) MG PO TABS: 325.0000 mg | ORAL_TABLET | Freq: Every day | ORAL | Status: DC

## 2012-05-28 MED ORDER — IBUPROFEN 600 MG PO TABS: 600.0000 mg | ORAL_TABLET | Freq: Four times a day (QID) | ORAL | Status: DC | PRN

## 2012-05-28 MED ORDER — NORETHINDRONE 0.35 MG PO TABS: 1.0000 | ORAL_TABLET | Freq: Every day | ORAL | Status: DC

## 2012-05-28 MED ORDER — OXYCODONE-ACETAMINOPHEN 5-325 MG PO TABS: 1.0000 | ORAL_TABLET | ORAL | Status: DC | PRN

## 2012-05-28 NOTE — Discharge Summary (Signed)
Physician Discharge Summary  Patient ID: CHANA LINDSTROM MRN: [id number] DOB/AGE: 1986-07-07 26 y.o.  Admit date: 05/25/2012 Discharge date: 05/28/2012  Admission Diagnoses: [redacted]w[redacted]d, srom, not inactive labor   Discharge Diagnoses:  Active Problems:  Depression  Anxiety  NSVD (normal spontaneous vaginal delivery)   Discharged Condition: stable  Hospital Course: [redacted]w[redacted]d, srom, not inactive labor, epirdural, vistaril, and stadol throughout labor, SVD, 2 degree MLL, normal involution, anemia   Consults: None  Significant Diagnostic Studies: labs:  Hemoglobin & Hematocrit     Component Value Date/Time   HGB 7.6* 05/27/2012 0530   HCT 23.8* 05/27/2012 0530     Treatments: IV hydration S: pain to bottom,tailbone hurts with getting out of bed,  little bleeding, slept     Breastfeeding, plans micronor  Discharge Exam: Blood pressure 124/79, pulse 67, temperature 97.4 F (36.3 C), temperature source Oral, resp. rate 18, height 4\' 9"  (1.448 m), weight 131 lb (59.421 kg), last menstrual period 07/28/2011, SpO2 98.00%, unknown if currently breastfeeding. General appearance: alert, cooperative and no distress BP 124/79  Pulse 67  Temp 97.4 F (36.3 C) (Oral)  Resp 18  Ht 4\' 9"  (1.448 m)  Wt 131 lb (59.421 kg)  BMI 28.35 kg/m2  SpO2 98%  LMP 07/28/2011  Breastfeeding? Unknown Physical Exam:  General: alert, cooperative and no distress Lochia: appropriate Uterine Fundus: firm 2 degree MLL well approximated no redness, edema, or drainage, no ecchymosis, no hemorrhoids DVT Evaluation: Negative Homan's sign. No significant calf/ankle edema. Lungs clear bilaterally AP RRR Bowel sounds active abd nt,  Ambulating with steady gait     Disposition: 01-Home or Self Care  Discharge Orders    Future Orders Please Complete By Expires   OB RESULTS CONSOLE RPR      Comments:   This external order was created through the Results Console.   OB RESULTS CONSOLE HIV antibody      Comments:   This external order was created through the Results Console.   OB RESULTS CONSOLE Rubella Antibody      Comments:   This external order was created through the Results Console.   OB RESULTS CONSOLE Hepatitis B surface antigen      Comments:   This external order was created through the Results Console.   OB RESULTS CONSOLE ABO/Rh      Comments:   This external order was created through the Results Console.   OB RESULTS CONSOLE Antibody Screen      Comments:   This external order was created through the Results Console.       Medication List     As of 05/28/2012 10:02 AM    STOP taking these medications         BENADRYL ALLERGY PO      TAKE these medications         ALPRAZolam 0.5 MG tablet   Commonly known as: XANAX   Take 0.5 mg by mouth 3 (three) times daily as needed. For anxiety      ibuprofen 600 MG tablet   Commonly known as: ADVIL,MOTRIN   Take 1 tablet (600 mg total) by mouth every 6 (six) hours as needed for pain.      norethindrone 0.35 MG tablet   Commonly known as: MICRONOR,CAMILA,ERRIN   Take 1 tablet (0.35 mg total) by mouth daily.      oxyCODONE-acetaminophen 5-325 MG per tablet   Commonly known as: PERCOCET/ROXICET   Take 1 tablet by mouth every 3 (three) hours as needed (  moderate - severe pain).      PRENATAL VITAMINS PO   Take 1 tablet by mouth daily.      senna-docusate 8.6-50 MG per tablet   Commonly known as: Senokot-S   Take 2 tablets by mouth at bedtime.           Follow-up Information    Follow up with [facility]. In 6 weeks.   Contact information:   [address] [place] [address] [phone]       reviewed [facility] handbook, how to avoid constipation, mirilax discussed, continue pnv and iron daily, plans mircronr. F/o office 6 weeks, s/s pp depression to report review, FOB at bedside, plans to continue xanax use. Had never had counseling for anxiety or  depression.  SignedLavera Guise 05/28/2012, 10:02 AM

## 2012-05-30 ENCOUNTER — Telehealth: Payer: Self-pay | Admitting: Obstetrics and Gynecology

## 2012-05-30 NOTE — Telephone Encounter (Signed)
TC to pt.  States is doing well," feeling better every day."  Has not contacted provider to discuss F/U Xanex. Is not sure of name.  To contact and call next week with information.

## 2012-06-17 ENCOUNTER — Telehealth: Payer: Self-pay | Admitting: Obstetrics and Gynecology

## 2012-06-17 NOTE — Telephone Encounter (Signed)
Pt states she was returning aa call from a week ago. Pt states she was told to f/u with PCP regarding Rx. Pt PCP kept her on Xanax and dosage is now 2.5.  [facility] CMA

## 2012-07-09 ENCOUNTER — Ambulatory Visit: Payer: Medicaid Other | Admitting: Obstetrics and Gynecology

## 2012-07-11 ENCOUNTER — Ambulatory Visit: Payer: Medicaid Other

## 2012-07-11 ENCOUNTER — Encounter: Payer: Medicaid Other | Admitting: Obstetrics and Gynecology

## 2012-08-07 ENCOUNTER — Telehealth: Payer: Self-pay | Admitting: Obstetrics and Gynecology

## 2012-08-07 ENCOUNTER — Telehealth: Payer: Self-pay

## 2012-08-07 MED ORDER — NORGESTIM-ETH ESTRAD TRIPHASIC 0.18/0.215/0.25 MG-35 MCG PO TABS: 1.0000 | ORAL_TABLET | Freq: Every day | ORAL | Status: DC

## 2012-08-07 NOTE — Telephone Encounter (Signed)
LM FOR PT TO CALL BACK. 

## 2012-08-07 NOTE — Telephone Encounter (Signed)
TC TO PT REGARDING MESSAGE. PT STATES THAT SHE NO LONGER ON MEDICAID  AND SHE NEED A CHEAPER RX. CALLED [facility] TO GET SUGGESTIONS AND SHE TOLD ME TO SEND IN ORTHOTRICYCLEN UNTIL PT HAVE AN ANNUAL EXAM.

## 2012-08-25 ENCOUNTER — Telehealth: Payer: Self-pay

## 2012-08-25 MED ORDER — NORGESTIM-ETH ESTRAD TRIPHASIC 0.18/0.215/0.25 MG-35 MCG PO TABS: 1.0000 | ORAL_TABLET | Freq: Every day | ORAL | Status: DC

## 2012-08-25 NOTE — Telephone Encounter (Signed)
Spoke with pt rgd msg informed rx sent to [facility] pt wil call pharm to see if rx is there if not will call back

## 2012-08-25 NOTE — Telephone Encounter (Signed)
Lm on vm tcb rgd msg 

## 2012-08-25 NOTE — Telephone Encounter (Signed)
Spoke with pt rgd msg informed rx resent to pharm pt voice understanding

## 2014-06-28 ENCOUNTER — Encounter (HOSPITAL_COMMUNITY): Payer: Self-pay | Admitting: Obstetrics and Gynecology

## 2014-08-27 NOTE — L&D Delivery Note (Signed)
Delivery Note At 3:28 AM a viable female "Kristi Perkins" was delivered via Vaginal, Spontaneous Delivery (Presentation: OA restituting to Left Occiput Anterior).  APGARS: 8, 9; weight 4 lb 4 oz (1928 g).   Placenta status: Intact, Spontaneous.  Cord: 3 vessels with the following complications: None.  Cord pH: NA Placenta to path due to IUGR. Brisk bleeding noted/continued after delivery of placenta despite IV Pitocin -- Cytotec 1000 mcg per rectum administered.  Anesthesia: Epidural  Episiotomy: None Lacerations: None Suture Repair: NA Est. Blood Loss (mL): 457  Mom to Women's Unit.  Baby to NICU.  Mom plans to bottlefeed, therefore Xanax and Lamictal resumed.  Birth control not discussed.  Plan SW consult for tumultuous relationship, bipolar disorder (on Lamictal), and Xanax for anxiety. UDS on admission positive for THC & amphetamines.     Sherre Scarlet 05/30/2015, 4:54 AM

## 2014-12-30 LAB — OB RESULTS CONSOLE HIV ANTIBODY (ROUTINE TESTING): HIV: NONREACTIVE

## 2014-12-30 LAB — OB RESULTS CONSOLE RPR: RPR: NONREACTIVE

## 2014-12-30 LAB — OB RESULTS CONSOLE RUBELLA ANTIBODY, IGM: RUBELLA: NON-IMMUNE/NOT IMMUNE

## 2014-12-30 LAB — OB RESULTS CONSOLE HEPATITIS B SURFACE ANTIGEN: HEP B S AG: NEGATIVE

## 2015-04-21 ENCOUNTER — Other Ambulatory Visit (HOSPITAL_COMMUNITY): Payer: Self-pay | Admitting: Obstetrics and Gynecology

## 2015-04-21 ENCOUNTER — Ambulatory Visit (HOSPITAL_COMMUNITY): Payer: Medicaid Other

## 2015-04-21 DIAGNOSIS — Z3A30 30 weeks gestation of pregnancy: Secondary | ICD-10-CM

## 2015-04-21 DIAGNOSIS — Z0374 Encounter for suspected problem with fetal growth ruled out: Secondary | ICD-10-CM

## 2015-04-21 DIAGNOSIS — O4403 Placenta previa specified as without hemorrhage, third trimester: Secondary | ICD-10-CM

## 2015-05-06 ENCOUNTER — Encounter (HOSPITAL_COMMUNITY): Payer: Self-pay | Admitting: *Deleted

## 2015-05-06 ENCOUNTER — Inpatient Hospital Stay (HOSPITAL_COMMUNITY)
Admission: AD | Admit: 2015-05-06 | Discharge: 2015-05-11 | DRG: 781 | Disposition: A | Discharge status: Home / Self Care | Payer: Medicaid Other | Source: Ambulatory Visit | Attending: Obstetrics and Gynecology | Admitting: Obstetrics and Gynecology

## 2015-05-06 ENCOUNTER — Inpatient Hospital Stay (HOSPITAL_COMMUNITY): Payer: Medicaid Other

## 2015-05-06 DIAGNOSIS — K469 Unspecified abdominal hernia without obstruction or gangrene: Secondary | ICD-10-CM

## 2015-05-06 DIAGNOSIS — O4103X1 Oligohydramnios, third trimester, fetus 1: Secondary | ICD-10-CM

## 2015-05-06 DIAGNOSIS — O288 Other abnormal findings on antenatal screening of mother: Secondary | ICD-10-CM

## 2015-05-06 DIAGNOSIS — O219 Vomiting of pregnancy, unspecified: Secondary | ICD-10-CM

## 2015-05-06 DIAGNOSIS — O2613 Low weight gain in pregnancy, third trimester: Secondary | ICD-10-CM

## 2015-05-06 DIAGNOSIS — O99343 Other mental disorders complicating pregnancy, third trimester: Secondary | ICD-10-CM | POA: Diagnosis present

## 2015-05-06 DIAGNOSIS — O212 Late vomiting of pregnancy: Secondary | ICD-10-CM | POA: Diagnosis present

## 2015-05-06 DIAGNOSIS — F1911 Other psychoactive substance abuse, in remission: Secondary | ICD-10-CM | POA: Diagnosis present

## 2015-05-06 DIAGNOSIS — F319 Bipolar disorder, unspecified: Secondary | ICD-10-CM | POA: Diagnosis present

## 2015-05-06 DIAGNOSIS — Z8249 Family history of ischemic heart disease and other diseases of the circulatory system: Secondary | ICD-10-CM

## 2015-05-06 DIAGNOSIS — O365931 Maternal care for other known or suspected poor fetal growth, third trimester, fetus 1: Secondary | ICD-10-CM

## 2015-05-06 DIAGNOSIS — E876 Hypokalemia: Secondary | ICD-10-CM | POA: Diagnosis present

## 2015-05-06 DIAGNOSIS — Z6820 Body mass index (BMI) 20.0-20.9, adult: Secondary | ICD-10-CM

## 2015-05-06 DIAGNOSIS — O1403 Mild to moderate pre-eclampsia, third trimester: Principal | ICD-10-CM | POA: Diagnosis present

## 2015-05-06 DIAGNOSIS — O4100X0 Oligohydramnios, unspecified trimester, not applicable or unspecified: Secondary | ICD-10-CM | POA: Diagnosis present

## 2015-05-06 DIAGNOSIS — O365930 Maternal care for other known or suspected poor fetal growth, third trimester, not applicable or unspecified: Secondary | ICD-10-CM | POA: Diagnosis present

## 2015-05-06 DIAGNOSIS — F419 Anxiety disorder, unspecified: Secondary | ICD-10-CM | POA: Diagnosis present

## 2015-05-06 DIAGNOSIS — Z87891 Personal history of nicotine dependence: Secondary | ICD-10-CM

## 2015-05-06 DIAGNOSIS — Z3A32 32 weeks gestation of pregnancy: Secondary | ICD-10-CM | POA: Diagnosis present

## 2015-05-06 DIAGNOSIS — O99013 Anemia complicating pregnancy, third trimester: Secondary | ICD-10-CM | POA: Diagnosis present

## 2015-05-06 DIAGNOSIS — IMO0002 Reserved for concepts with insufficient information to code with codable children: Secondary | ICD-10-CM

## 2015-05-06 DIAGNOSIS — D649 Anemia, unspecified: Secondary | ICD-10-CM | POA: Diagnosis present

## 2015-05-06 DIAGNOSIS — O4103X0 Oligohydramnios, third trimester, not applicable or unspecified: Secondary | ICD-10-CM | POA: Diagnosis present

## 2015-05-06 DIAGNOSIS — Z833 Family history of diabetes mellitus: Secondary | ICD-10-CM

## 2015-05-06 LAB — COMPREHENSIVE METABOLIC PANEL
ALBUMIN: 2.6 g/dL — AB (ref 3.5–5.0)
ALK PHOS: 144 U/L — AB (ref 38–126)
ALT: 31 U/L (ref 14–54)
ANION GAP: 14 (ref 5–15)
AST: 61 U/L — ABNORMAL HIGH (ref 15–41)
BUN: 6 mg/dL (ref 6–20)
CALCIUM: 8.7 mg/dL — AB (ref 8.9–10.3)
CHLORIDE: 92 mmol/L — AB (ref 101–111)
CO2: 28 mmol/L (ref 22–32)
Creatinine, Ser: 0.95 mg/dL (ref 0.44–1.00)
GFR calc non Af Amer: >60 mL/min (ref >60)
GLUCOSE: 102 mg/dL — AB (ref 65–99)
POTASSIUM: 2.3 mmol/L — AB (ref 3.5–5.1)
SODIUM: 134 mmol/L — AB (ref 135–145)
Total Bilirubin: 1.3 mg/dL — ABNORMAL HIGH (ref 0.3–1.2)
Total Protein: 5.8 g/dL — ABNORMAL LOW (ref 6.5–8.1)

## 2015-05-06 LAB — URINALYSIS, ROUTINE W REFLEX MICROSCOPIC
GLUCOSE, UA: NEGATIVE mg/dL
KETONES UR: 15 mg/dL — AB
NITRITE: NEGATIVE
PROTEIN: NEGATIVE mg/dL
Specific Gravity, Urine: <1.005 — ABNORMAL LOW (ref 1.005–1.030)
Urobilinogen, UA: 1 mg/dL (ref 0.0–1.0)
pH: 6.5 (ref 5.0–8.0)

## 2015-05-06 LAB — URINE MICROSCOPIC-ADD ON

## 2015-05-06 LAB — CBC
HCT: 32.9 % — ABNORMAL LOW (ref 36.0–46.0)
HEMOGLOBIN: 11 g/dL — AB (ref 12.0–15.0)
MCH: 30.1 pg (ref 26.0–34.0)
MCHC: 33.4 g/dL (ref 30.0–36.0)
MCV: 89.9 fL (ref 78.0–100.0)
PLATELETS: 273 10*3/uL (ref 150–400)
RBC: 3.66 MIL/uL — ABNORMAL LOW (ref 3.87–5.11)
RDW: 14.2 % (ref 11.5–15.5)
WBC: 13.5 10*3/uL — ABNORMAL HIGH (ref 4.0–10.5)

## 2015-05-06 LAB — TYPE AND SCREEN
ABO/RH(D): A POS
Antibody Screen: NEGATIVE

## 2015-05-06 MED ORDER — LACTATED RINGERS IV BOLUS (SEPSIS)
1000.0000 mL | Freq: Once | INTRAVENOUS | Status: AC
  Administered 2015-05-06: 1000 mL via INTRAVENOUS

## 2015-05-06 MED ORDER — ZOLPIDEM TARTRATE 5 MG PO TABS
5.0000 mg | ORAL_TABLET | Freq: Every evening | ORAL | Status: DC | PRN
  Administered 2015-05-10 (×2): 5 mg via ORAL
  Filled 2015-05-06 (×3): qty 1

## 2015-05-06 MED ORDER — POTASSIUM CHLORIDE 2 MEQ/ML IV SOLN
Freq: Once | INTRAVENOUS | Status: AC
  Administered 2015-05-06: 17:00:00 via INTRAVENOUS
  Filled 2015-05-06: qty 1000

## 2015-05-06 MED ORDER — DOCUSATE SODIUM 100 MG PO CAPS
100.0000 mg | ORAL_CAPSULE | Freq: Every day | ORAL | Status: DC
  Administered 2015-05-07 – 2015-05-11 (×3): 100 mg via ORAL
  Filled 2015-05-06 (×3): qty 1

## 2015-05-06 MED ORDER — LAMOTRIGINE 25 MG PO TABS
50.0000 mg | ORAL_TABLET | Freq: Every day | ORAL | Status: DC
  Administered 2015-05-07 – 2015-05-11 (×5): 50 mg via ORAL
  Filled 2015-05-06 (×6): qty 2

## 2015-05-06 MED ORDER — POTASSIUM CHLORIDE 2 MEQ/ML IV SOLN
Freq: Once | INTRAVENOUS | Status: AC
  Administered 2015-05-06: 15:00:00 via INTRAVENOUS
  Filled 2015-05-06: qty 1000

## 2015-05-06 MED ORDER — DEXTROSE IN LACTATED RINGERS 5 % IV SOLN
INTRAVENOUS | Status: DC
  Administered 2015-05-06 – 2015-05-09 (×8): via INTRAVENOUS

## 2015-05-06 MED ORDER — PROMETHAZINE HCL 25 MG/ML IJ SOLN
25.0000 mg | Freq: Once | INTRAMUSCULAR | Status: AC
  Administered 2015-05-06: 25 mg via INTRAVENOUS
  Filled 2015-05-06: qty 1

## 2015-05-06 NOTE — Progress Notes (Signed)
CRITICAL VALUE ALERT  Critical value received:  Potassium 2.3  Date of notification:  05/06/15  Time of notification:  1422  Critical value read back:Yes.    Nurse who received alert:  K.Jasilyn Holderman,RN  MD notified (1st page):  L..Leftwich-Kirby,CNM  Time of first page:  1423      Responding MD:  L.Leftwich-Kirby,CNM  Time MD responded: 1423

## 2015-05-06 NOTE — MAU Provider Note (Signed)
Chief Complaint:  Emesis During Pregnancy   First Provider Initiated Contact with Patient 05/06/15 1325      HPI: Kristi Perkins is a 29 y.o. G3P1011 at 91w2dwho presents to maternity admissions reporting nausea/vomiting x 3-4 days reporting she is unable to keep down any food or fluids at all.  She reports she received a prescription for nausea medication in the office today but has not filled it yet.   She reports good fetal movement, denies LOF, vaginal bleeding, vaginal itching/burning, urinary symptoms, h/a, dizziness, or fever/chills.    Emesis  This is a new problem. The current episode started in the past 7 days. The problem occurs 5 to 10 times per day. The problem has been gradually worsening. The emesis has an appearance of stomach contents. There has been no fever. Associated symptoms include headaches and weight loss. Pertinent negatives include no abdominal pain, chest pain, chills, diarrhea, dizziness or fever. She has tried nothing for the symptoms.    Past Medical History: Past Medical History  Diagnosis Date  . Hernia   . Anxiety     on xanax at beginning of preg   . FH: anemia     Father  . FH: Parkinson's disease     Father  . UTI (urinary tract infection)      x1  . H/O varicella   . FH: heart disease   . FH: diabetes mellitus   . FH: migraines   . FH: kidney disease   . FH: cancer   . FH: liver disease   . Low iron   . Hx of ovarian cyst   . H/O cocaine abuse 2006     had rehab   . Depression     bipolar    Past obstetric history: OB History  Gravida Para Term Preterm AB SAB TAB Ectopic Multiple Living  0 1 0 1 0 0 1    # Outcome Date GA Lbr Len/2nd Weight Sex Delivery Anes PTL Lv  3 Current           2 Term 05/26/12 [redacted]w[redacted]d 25:45 / 10:13 3.204 kg (7 lb 1 oz) F Vag-Spont EPI,Local  Y     Comments: Caput  1 TAB 2011 [redacted]w[redacted]d   U    N      Past Surgical History: History reviewed. No pertinent past surgical history.  Family  History: Family History  Problem Relation Age of Onset  . Heart disease Father   . Hypertension Father   . Diabetes Father   . Kidney disease Father   . Depression Father   . Alcohol abuse Father   . Cancer Maternal Grandmother   . Kidney disease Paternal Grandfather     Social History: Social History  Substance Use Topics  . Smoking status: Former Games developer  . Smokeless tobacco: Never Used  . Alcohol Use: No     Comment: 6 pack/ day    Allergies: No Known Allergies  Meds:  Prescriptions prior to admission  Medication Sig Dispense Refill Last Dose  . ALPRAZolam (XANAX) 1 MG tablet Take 1 mg by mouth 3 (three) times daily as needed for anxiety.   05/05/2015 at Unknown time  . lamoTRIgine (LAMICTAL) 25 MG tablet Take 50 mg by mouth daily.   05/06/2015 at Unknown time  . PRENATAL VITAMINS PO Take 1 tablet by mouth daily.    05/06/2015 at Unknown time  . Menthol, Topical Analgesic, 5 % PADS Apply 1 each topically daily  as needed (for back pain).   Unknown at Unknown time  . [DISCONTINUED] ferrous sulfate 325 (65 FE) MG tablet Take 1 tablet (325 mg total) by mouth daily. 60 tablet 1   . [DISCONTINUED] ibuprofen (ADVIL,MOTRIN) 600 MG tablet Take 1 tablet (600 mg total) by mouth every 6 (six) hours as needed for pain. 30 tablet 1   . [DISCONTINUED] norethindrone (ORTHO MICRONOR) 0.35 MG tablet Take 1 tablet (0.35 mg total) by mouth daily. 1 Package 11   . [DISCONTINUED] Norgestimate-Ethinyl Estradiol Triphasic 0.18/0.215/0.25 MG-35 MCG tablet Take 1 tablet by mouth daily. 1 Package 5   . [DISCONTINUED] oxyCODONE-acetaminophen (PERCOCET/ROXICET) 5-325 MG per tablet Take 1 tablet by mouth every 3 (three) hours as needed (moderate - severe pain). 30 tablet 0   . [DISCONTINUED] senna-docusate (SENOKOT-S) 8.6-50 MG per tablet Take 2 tablets by mouth at bedtime. 60 tablet 1     ROS:  Review of Systems  Constitutional: Positive for weight loss. Negative for fever, chills and fatigue.  HENT:  Negative for sinus pressure.   Eyes: Negative for photophobia.  Respiratory: Negative for shortness of breath.   Cardiovascular: Negative for chest pain.  Gastrointestinal: Positive for vomiting. Negative for nausea, abdominal pain, diarrhea and constipation.  Genitourinary: Negative for dysuria, frequency, flank pain, vaginal bleeding, vaginal discharge, difficulty urinating, vaginal pain and pelvic pain.  Musculoskeletal: Negative for neck pain.  Neurological: Positive for headaches. Negative for dizziness and weakness.  Psychiatric/Behavioral: Negative.      I have reviewed patient's Past Medical Hx, Surgical Hx, Family Hx, Social Hx, medications and allergies.   Physical Exam   VS wnl  Constitutional:  Cardiovascular: normal rate Respiratory: normal effort GI: Abd soft, non-tender, gravid appropriate for gestational age.  MS: Extremities nontender, no edema, normal ROM Neurologic: Alert and oriented x 4.  GU: Neg CVAT.     FHT:  Baseline 155, moderate variability with periods of minimal variability, accelerations present, variable x 1 Contractions: none on toco or to palpation   Labs:  Results for orders placed or performed during the hospital encounter of 05/06/15 (from the past 168 hour(s))  Culture, OB Urine   Collection Time: 05/06/15 12:15 PM  Result Value Ref Range   Specimen Description URINE, CLEAN CATCH    Special Requests NONE    Culture      MULTIPLE SPECIES PRESENT, SUGGEST RECOLLECTION 3,000 COLONIES/mL GROUP B STREP(S.AGALACTIAE)ISOLATED TESTING AGAINST S. AGALACTIAE NOT ROUTINELY PERFORMED DUE TO PREDICTABILITY OF AMP/PEN/VAN SUSCEPTIBILITY. Performed at [facility]    Report Status 05/08/2015 FINAL   Urinalysis, Routine w reflex microscopic (not at [facility])   Collection Time: 05/06/15 12:15 PM  Result Value Ref Range   Color, Urine YELLOW YELLOW   APPearance CLEAR CLEAR   Specific Gravity, Urine <1.005 (L) 1.005 - 1.030   pH 6.5 5.0 - 8.0    Glucose, UA NEGATIVE NEGATIVE mg/dL   Hgb urine dipstick TRACE (A) NEGATIVE   Bilirubin Urine SMALL (A) NEGATIVE   Ketones, ur 15 (A) NEGATIVE mg/dL   Protein, ur NEGATIVE NEGATIVE mg/dL   Urobilinogen, UA 1.0 0.0 - 1.0 mg/dL   Nitrite NEGATIVE NEGATIVE   Leukocytes, UA LARGE (A) NEGATIVE  Urine microscopic-add on   Collection Time: 05/06/15 12:15 PM  Result Value Ref Range   Squamous Epithelial / LPF MANY (A) RARE   WBC, UA 3-6 <3 WBC/hpf   RBC / HPF 0-2 <3 RBC/hpf   Bacteria, UA RARE RARE  CBC   Collection Time: 05/06/15  1:27  PM  Result Value Ref Range   WBC 13.5 (H) 4.0 - 10.5 K/uL   RBC 3.66 (L) 3.87 - 5.11 MIL/uL   Hemoglobin 11.0 (L) 12.0 - 15.0 g/dL   HCT 82.9 (L) 56.2 - 13.0 %   MCV 89.9 78.0 - 100.0 fL   MCH 30.1 26.0 - 34.0 pg   MCHC 33.4 30.0 - 36.0 g/dL   RDW 86.5 78.4 - 69.6 %   Platelets 273 150 - 400 K/uL  Comprehensive metabolic panel   Collection Time: 05/06/15  1:27 PM  Result Value Ref Range   Sodium 134 (L) 135 - 145 mmol/L   Potassium 2.3 (LL) 3.5 - 5.1 mmol/L   Chloride 92 (L) 101 - 111 mmol/L   CO2 28 22 - 32 mmol/L   Glucose, Bld 102 (H) 65 - 99 mg/dL   BUN 6 6 - 20 mg/dL   Creatinine, Ser 2.95 0.44 - 1.00 mg/dL   Calcium 8.7 (L) 8.9 - 10.3 mg/dL   Total Protein 5.8 (L) 6.5 - 8.1 g/dL   Albumin 2.6 (L) 3.5 - 5.0 g/dL   AST 61 (H) 15 - 41 U/L   ALT 31 14 - 54 U/L   Alkaline Phosphatase 144 (H) 38 - 126 U/L   Total Bilirubin 1.3 (H) 0.3 - 1.2 mg/dL   GFR calc non Af Amer >60 >60 mL/min   GFR calc Af Amer >60 >60 mL/min   Anion gap 14 5 - 15  Type and screen   Collection Time: 05/06/15  7:12 PM  Result Value Ref Range   ABO/RH(D) A POS    Antibody Screen NEG    Sample Expiration 05/09/2015      --/--/A POS (09/09 1912)  Imaging:  BPP 8/8 with AFI 4.88  MAU Course/MDM: I have ordered labs and reviewed results.  Consult Dr Normand Sloop, reviewed assessment and labs.  Treatments in MAU included IV fluids, Phenergan 25 mg IV, and IV  potassium replacement 20 meqs over 2 hours.  Bedside BPP ordered related to FHR tracing with periods of minimal variability.  Consult Dr Normand Sloop related to results.    Assessment: 1. Nausea/vomiting in pregnancy   2. NST (non-stress test) nonreactive   3. Oligohydramnios, third trimester, fetus 1   4. AFI (amniotic fluid index) borderline low   5. Oligohydramnios   6. Oligohydramnios without rupture of membranes, third trimester, fetus 1   7. Intrauterine growth restriction affecting care of mother, antepartum, third trimester, fetus 1   8. Insufficient weight gain of pregnancy, third trimester   9. [redacted] weeks gestation of pregnancy     Plan: Admit for 23 hour observation Repeat BPP/AFI in the am    Sharen Counter Certified Nurse-Midwife 05/08/2015 6:46 PM

## 2015-05-06 NOTE — MAU Note (Signed)
Pt reports she has had a lot of nausea and vomiting unable to keep anything down. Sent in for fluid and medication.

## 2015-05-06 NOTE — H&P (Signed)
Kristi Perkins is a 29 y.o. female, G3P1011 at 74 2/7 weeks, presenting for admission due to persistent N/V this week, with oligohydramnios and elevated dopplers on [place] today, with recent growth assessment of fetus showing SGA vs IUGR vs constitutionally small fetus.  She reports she received a prescription for nausea medication in the office today but has not filled it yet.  She reports good fetal movement, denies LOF, vaginal bleeding, vaginal itching/burning, urinary symptoms, h/a, dizziness, or fever/chills.   Patient Active Problem List   Diagnosis Date Noted  . Bipolar 1 disorder 05/06/2015  . Hypokalemia 05/06/2015  . Oligohydramnios 05/06/2015  . H/O placenta previa 05/06/2015  . Nausea & vomiting 05/06/2015  . H/O drug abuse in past 05/06/2015  . Poor fetal growth--IUGR vs SGA vs constitutionally small 05/06/2015  . Body mass index (BMI) of 20.0-20.9 in adult 05/06/2015  . Hernia 05/06/2015  . Short stature 05/04/2012  . Depression 12/26/2011  . Anxiety 12/26/2011  Abnormal doppler flow studies today  Meds:  Prescriptions prior to admission  Medication Sig Dispense Refill Last Dose  . ALPRAZolam (XANAX) 1 MG tablet Take 1 mg by mouth 3 (three) times daily as needed for anxiety.   05/05/2015 at Unknown time  . lamoTRIgine (LAMICTAL) 25 MG tablet Take 50 mg by mouth daily.   05/06/2015 at Unknown time  . Menthol, Topical Analgesic, 5 % PADS Apply 1 each topically daily as needed (for back pain).   05/05/2015 at Unknown time  . PRENATAL VITAMINS PO Take 1 tablet by mouth daily.    05/06/2015 at Unknown time  . [DISCONTINUED] ferrous sulfate 325 (65 FE) MG tablet Take 1 tablet (325 mg total) by mouth daily. 60 tablet 1   . [DISCONTINUED] ibuprofen (ADVIL,MOTRIN) 600 MG tablet Take 1 tablet (600 mg total) by mouth every 6 (six) hours as needed for pain. 30 tablet 1   . [DISCONTINUED] norethindrone (ORTHO MICRONOR) 0.35 MG tablet  Take 1 tablet (0.35 mg total) by mouth daily. 1 Package 11   . [DISCONTINUED] Norgestimate-Ethinyl Estradiol Triphasic 0.18/0.215/0.25 MG-35 MCG tablet Take 1 tablet by mouth daily. 1 Package 5   . [DISCONTINUED] oxyCODONE-acetaminophen (PERCOCET/ROXICET) 5-325 MG per tablet Take 1 tablet by mouth every 3 (three) hours as needed (moderate - severe pain). 30 tablet 0   . [DISCONTINUED] senna-docusate (SENOKOT-S) 8.6-50 MG per tablet Take 2 tablets by mouth at bedtime. 60 tablet 1     ROS:  Review of Systems  Constitutional: Positive for weight loss. Negative for fever, chills and fatigue.  HENT: Negative for sinus pressure.  Eyes: Negative for photophobia.  Respiratory: Negative for shortness of breath.  Cardiovascular: Negative for chest pain.  Gastrointestinal: Positive for vomiting. Negative for nausea, abdominal pain, diarrhea and constipation.  Genitourinary: Negative for dysuria, frequency, flank pain, vaginal bleeding, vaginal discharge, difficulty urinating, vaginal pain and pelvic pain.  Musculoskeletal: Negative for neck pain.  Neurological: Positive for headaches. Negative for dizziness and weakness.  Psychiatric/Behavioral: Negative.   Physical Exam  Patient Vitals for the past 24 hrs:  BP Temp Pulse Resp SpO2 Height Weight  05/06/15 1249 - - 114 - 97 % - -  05/06/15 1216 104/64 mmHg 98.4 F (36.9 C) (!) 132 18 - 4\' 9"  (1.448 m) 38.102 kg (84 lb)    MAU Course/MDM: Treatments in MAU included IV fluids, Phenergan 25 mg IV, and IV potassium replacement 20 meqs over 2 hours. Bedside BPP ordered related to FHR tracing with periods of minimal variability.  History of present pregnancy: Patient entered care at 64 1/7 weeks, in transfer from Dr. Shawnie Pons in [place].   EDC of 06/29/15 was established by 14 1/7 week [place] and was congruent with LMP dating.   Anatomy scan:  18 1/7 weeks, with normal findings and a placenta previa  noted. Additional [place] evaluations:  04/19/15--EFW 2+11, 6%ile, AFI 16, 55%ile, anterior placenta. Significant prenatal events:  Transferred to [facility] from Dr. Shawnie Pons in [place].  Had several visits there, with previa noted on anatomy US.  Patient had been on Lamictal, Lithium, and Xanax prior to pregnancy, but was stopped on advice of her PCP with dx of pregnancy.  Has been struggling more with anxiety--referred to [facility], but has not had the appt yet.  Started on Vistaril at last office visit.  [place] on 8/23 showed anterior placenta, growth at 6%ile, and normal fluid.   Last evaluation:  04/19/15--weight 92, BMI 19.9  Past obstetric history: OB History  Gravida Para Term Preterm AB SAB TAB Ectopic Multiple Living  0 1 0 1 0 0 1    # Outcome Date GA Lbr Len/2nd Weight Sex Delivery Anes PTL Lv  3 Current           2 Term 05/26/12 [redacted]w[redacted]d 25:45 / 10:13 3.204 kg (7 lb 1 oz) F Vag-Spont EPI,Local  Y   Comments: Caput  1 TAB 2011 [redacted]w[redacted]d   U              Past Medical History  Diagnosis Date  . Hernia   . Anxiety     on xanax at beginning of preg   . FH: anemia     Father  . FH: Parkinson's disease     Father  . UTI (urinary tract infection)      x1  . H/O varicella   . FH: heart disease   . FH: diabetes mellitus   . FH: migraines   . FH: kidney disease   . FH: cancer   . FH: liver disease   . Low iron   . Hx of ovarian cyst   . H/O cocaine abuse 2006     had rehab   . Depression     bipolar   History reviewed. No pertinent past surgical history.   Family History: family history includes Alcohol abuse in her father; Cancer in her maternal grandmother; Depression in her father; Diabetes in her father; Heart disease in her father; Hypertension in her father; Kidney disease in her father and paternal grandfather.   Social History:  reports that she has quit smoking. She has never used smokeless tobacco. She reports  that she does not drink alcohol or use illicit drugs. Patient is Caucasian, a Games developer, high-school educated, married to [name].  Prenatal Transfer Tool  Maternal Diabetes: No Genetic Screening: Declined Maternal Ultrasounds/Referrals: Abnormal:  Findings:   IUGR vs SGA, elevated doppler on admission US Fetal Ultrasounds or other Referrals:  Referred to Materal Fetal Medicine  Maternal Substance Abuse:  No Significant Maternal Medications:  None--previously on Lamictal, Lithium, Xanax, stopped with UPT Significant Maternal Lab Results: None  TDAP NA Flu NA  ROS:  N/V, +FM  No Known Allergies     Blood pressure 130/77, pulse 66, temperature 98.6 F (37 C), temperature source Oral, resp. rate 16, height  (1.448 m), weight 38.102 kg (84 lb), SpO2 97 %, unknown if currently breastfeeding.  In NAD--mood and affect stable. Chest clear Heart RRR without murmur  Abd gravid, NT, FH 26 Pelvic: Closed to MAU CNM exam Ext: WNL  PELVIC EXAM: Cervix pink, visually closed, without lesion, scant white creamy discharge, vaginal walls and external genitalia normal Bimanual exam: Cervix 0/long/high, firm, anterior, neg CMT, adnexa without tenderness, enlargement, or mass   FHR: Category 2--segments of decreased variability, baseline 150-160 UCs:  None  Prenatal labs: ABO, Rh: --/--/A POS (09/09 1912)A+ Antibody: NEG (09/09 1912)Neg Rubella:   Non-immune RPR:   NR HBsAg:   Neg HIV:   NR GBS:  NA Sickle cell/Hgb electrophoresis:  NA Pap:  Unknown GC:  Unknown Chlamydia:  Unknown Genetic screenings:  Declined Glucola:  WNL Other:   Hgb 11.6 at NOB, 10.7 at 28 weeks TSH 0.255 12/30/14    Assessment/Plan: IUP at 32 2/7 weeks N/V x 1 week Hypokalemia Elevated AST Oligohydramnios Elevated doppler flow, no reverse/absent flow   Plan: Admit to Antenatal Unit per consult with Dr. Normand Sloop IV hydration with D5LR K+ replacement occurred in MAU with 2 runs of K+ Continuous  EFM Repeat US on Sunday for growth, BPP, fluid, and dopplers. Continue Lamictal CMP tomorrow.  Nyra Capes, [place] 05/06/2015, 9:46 PM

## 2015-05-06 NOTE — Progress Notes (Signed)
In to readjust [place] monitor but patient refuses. Pushes hand away. Will notify provider.

## 2015-05-06 NOTE — Progress Notes (Signed)
Kristi Perkins [id number]  Subjective: Nurse call reports patient refuse to allow for monitor adjustment.  States that fetus charts well when she is able to adjust properly. Strip and Chart Reviewed.  Objective:  Filed Vitals:   05/06/15 1249 05/06/15 1829 05/06/15 2141 05/06/15 2240  BP:  139/72 130/77   Pulse: 114 72 66   Temp:  98.6 F (37 C)    TempSrc:  Oral    Resp:  Height:      Weight:      SpO2: 97%       FHR: 135 bpm, Mod Var, -Decels, +Accels UC: None Graphed  Assessment: IUP at [redacted]w[redacted]d Cat I FT Oligo IUGR N/V  Plan: -Nurse instructed to chart patient's refusal -Will continue monitoring as ordered  -Will consider NST or decreased monitoring after MFM evaluation  Sabas Sous, CNM 05/06/2015 11:40 PM

## 2015-05-07 DIAGNOSIS — O212 Late vomiting of pregnancy: Secondary | ICD-10-CM | POA: Diagnosis present

## 2015-05-07 DIAGNOSIS — Z87891 Personal history of nicotine dependence: Secondary | ICD-10-CM | POA: Diagnosis not present

## 2015-05-07 DIAGNOSIS — F419 Anxiety disorder, unspecified: Secondary | ICD-10-CM | POA: Diagnosis present

## 2015-05-07 DIAGNOSIS — O365930 Maternal care for other known or suspected poor fetal growth, third trimester, not applicable or unspecified: Secondary | ICD-10-CM | POA: Diagnosis present

## 2015-05-07 DIAGNOSIS — O99013 Anemia complicating pregnancy, third trimester: Secondary | ICD-10-CM | POA: Diagnosis present

## 2015-05-07 DIAGNOSIS — F319 Bipolar disorder, unspecified: Secondary | ICD-10-CM | POA: Diagnosis present

## 2015-05-07 DIAGNOSIS — O4103X0 Oligohydramnios, third trimester, not applicable or unspecified: Secondary | ICD-10-CM | POA: Diagnosis present

## 2015-05-07 DIAGNOSIS — Z8249 Family history of ischemic heart disease and other diseases of the circulatory system: Secondary | ICD-10-CM | POA: Diagnosis not present

## 2015-05-07 DIAGNOSIS — E876 Hypokalemia: Secondary | ICD-10-CM | POA: Diagnosis present

## 2015-05-07 DIAGNOSIS — O1403 Mild to moderate pre-eclampsia, third trimester: Secondary | ICD-10-CM | POA: Diagnosis not present

## 2015-05-07 DIAGNOSIS — Z833 Family history of diabetes mellitus: Secondary | ICD-10-CM | POA: Diagnosis not present

## 2015-05-07 DIAGNOSIS — O99343 Other mental disorders complicating pregnancy, third trimester: Secondary | ICD-10-CM | POA: Diagnosis present

## 2015-05-07 DIAGNOSIS — D649 Anemia, unspecified: Secondary | ICD-10-CM | POA: Diagnosis present

## 2015-05-07 DIAGNOSIS — Z3A32 32 weeks gestation of pregnancy: Secondary | ICD-10-CM | POA: Diagnosis present

## 2015-05-07 LAB — COMPREHENSIVE METABOLIC PANEL
ALT: 30 U/L (ref 14–54)
AST: 47 U/L — AB (ref 15–41)
Albumin: 2.2 g/dL — ABNORMAL LOW (ref 3.5–5.0)
Alkaline Phosphatase: 126 U/L (ref 38–126)
Anion gap: 9 (ref 5–15)
BUN: <5 mg/dL — ABNORMAL LOW (ref 6–20)
CHLORIDE: 99 mmol/L — AB (ref 101–111)
CO2: 29 mmol/L (ref 22–32)
CREATININE: 0.7 mg/dL (ref 0.44–1.00)
Calcium: 8 mg/dL — ABNORMAL LOW (ref 8.9–10.3)
Glucose, Bld: 126 mg/dL — ABNORMAL HIGH (ref 65–99)
POTASSIUM: 2.4 mmol/L — AB (ref 3.5–5.1)
SODIUM: 137 mmol/L (ref 135–145)
Total Bilirubin: 1.1 mg/dL (ref 0.3–1.2)
Total Protein: 4.9 g/dL — ABNORMAL LOW (ref 6.5–8.1)

## 2015-05-07 MED ORDER — BOOST / RESOURCE BREEZE PO LIQD
1.0000 | Freq: Three times a day (TID) | ORAL | Status: DC
  Administered 2015-05-08 – 2015-05-11 (×4): 1 via ORAL
  Filled 2015-05-07 (×14): qty 1

## 2015-05-07 MED ORDER — POTASSIUM CHLORIDE 10 MEQ/100ML IV SOLN
10.0000 meq | INTRAVENOUS | Status: AC
  Administered 2015-05-07 (×3): 10 meq via INTRAVENOUS
  Filled 2015-05-07 (×3): qty 100

## 2015-05-07 MED ORDER — ONDANSETRON HCL 4 MG/2ML IJ SOLN
4.0000 mg | Freq: Four times a day (QID) | INTRAMUSCULAR | Status: DC | PRN
  Administered 2015-05-07 – 2015-05-08 (×2): 4 mg via INTRAVENOUS
  Filled 2015-05-07 (×2): qty 2

## 2015-05-07 MED ORDER — ALPRAZOLAM 0.5 MG PO TABS
1.0000 mg | ORAL_TABLET | Freq: Three times a day (TID) | ORAL | Status: DC
  Administered 2015-05-07 – 2015-05-11 (×14): 1 mg via ORAL
  Filled 2015-05-07 (×15): qty 2

## 2015-05-07 NOTE — Plan of Care (Signed)
Problem: Consults Goal: Birthing Suites Patient Information Press F2 to bring up selections list  Outcome: Not Applicable Date Met:  06/26/58  Patient admitted to antenatal

## 2015-05-07 NOTE — Progress Notes (Signed)
Hospital day 1  pregnancy at [redacted]w[redacted]d--IUP at 32.3 wks, N/V, oligohydramnios, elevated dopplers, questionable IUGR.  S:  Pt resting, feels tired and hungry. No vomiting but requesting medication for nausea.        Reporting increased anxiety. Seen by [facility] within last two weeks, restarted on 1 mg of Xanax          TID and Lamictal.       Husband at bedside      Perception of contractions: none      Vaginal bleeding: none        Vaginal discharge:  none   O: BP 102/61 mmHg  Pulse 72  Temp(Src) 98.4 F (36.9 C) (Oral)  Resp 16  Ht  (1.448 m)  Wt 38.102 kg (84 lb)  BMI 18.17 kg/m2  SpO2 97%      Fetal tracings:  Cat 1 , FHR 140-150, accels present, no decels      Contractions:   None      Uterus gravid and non-tender      Extremities:   No edema       Pt cachetic             Labs:   CMP Latest Ref Rng 05/07/2015 05/06/2015 11/23/2011  Glucose 65 - 99 mg/dL 409(W) 119(J) 97  BUN 6 - 20 mg/dL <4(N) 6 3(L)  Creatinine 0.44 - 1.00 mg/dL 8.29 5.62 1.30  Sodium 135 - 145 mmol/L 137 134(L) 137  Potassium 3.5 - 5.1 mmol/L 2.4(LL) 2.3(LL) 3.4(L)  Chloride 101 - 111 mmol/L 99(L) 92(L) 103  CO2 22 - 32 mmol/L 29 28 -  Calcium 8.9 - 10.3 mg/dL 8.0(L) 8.7(L) -  Total Protein 6.5 - 8.1 g/dL 4.9(L) 5.8(L) -  Total Bilirubin 0.3 - 1.2 mg/dL 1.1 8.6(V) -  Alkaline Phos 38 - 126 U/L 126 144(H) -  AST 15 - 41 U/L 47(H) 61(H) -  ALT 14 - 54 U/L 30 31 -          Meds:   . ALPRAZolam  1 mg Oral TID  . docusate sodium  100 mg Oral Daily  . lamoTRIgine  50 mg Oral Daily    A: [redacted]w[redacted]d with N/V, oligohydramnios, elevated dopplers, questionable IUGR.       Condition stable       Hypokalemia       Bipolar with significant anxiety         P: Continue current plan of care      Trial advancement of diet       Rx Xanax and Zofran      Consulted with Dr. Normand Sloop        K+ replacement to continue with 3 runs of K+      Upcoming tests/treatments:   05/08/15 : [place] MFM - BPP w/o nonstress and  complete US for growth      MDs will follow   Nigel Bridgeman CNM, [place] 05/07/2015 12:31 PM

## 2015-05-07 NOTE — Progress Notes (Signed)
Initial Nutrition Assessment  DOCUMENTATION CODES:   Not applicable  INTERVENTION:  Boost Breeze   NUTRITION DIAGNOSIS:   Inadequate oral intake related to nausea, vomiting as evidenced by percent weight loss.   GOAL:   Weight gain (Tolerance of po diet)  MONITOR:   Weight trends  REASON FOR ASSESSMENT:   Malnutrition Screening Tool Reported >/= 10 lb unintentional weight loss per admission nutrition screen    ASSESSMENT:     No Diet order on file. Pt is 32 3/7  Weeks with Hx of n/v, oligohydramnios, and questionable  IUGR. Weight of 92 lbs reported on 8/23, 8.6 % loss currently from this weight. Pt likely with mild to moderate degree of malnutrition based on weight loss  Diet Order:     Height:   Ht Readings from Last 1 Encounters:  05/06/15  (1.448 m)    Weight:   Wt Readings from Last 1 Encounters:  05/06/15 84 lb (38.102 kg)    Ideal Body Weight:     BMI:  Body mass index is 18.17 kg/(m^2).  Estimated Nutritional Needs:   Kcal:  1400-1600-  Protein:  68-79 g  Fluid:  1.8 L  EDUCATION NEEDS:   No education needs identified at this time  Inez Pilgrim.Odis Luster LDN Neonatal Nutrition Support Specialist/RD III Pager [phone]      Phone [phone]

## 2015-05-08 ENCOUNTER — Inpatient Hospital Stay (HOSPITAL_COMMUNITY): Payer: Medicaid Other

## 2015-05-08 DIAGNOSIS — O219 Vomiting of pregnancy, unspecified: Secondary | ICD-10-CM | POA: Diagnosis present

## 2015-05-08 LAB — CBC
HEMATOCRIT: 28.8 % — AB (ref 36.0–46.0)
Hemoglobin: 9.7 g/dL — ABNORMAL LOW (ref 12.0–15.0)
MCH: 30.7 pg (ref 26.0–34.0)
MCHC: 33.7 g/dL (ref 30.0–36.0)
MCV: 91.1 fL (ref 78.0–100.0)
PLATELETS: 189 10*3/uL (ref 150–400)
RBC: 3.16 MIL/uL — ABNORMAL LOW (ref 3.87–5.11)
RDW: 14 % (ref 11.5–15.5)
WBC: 9.4 10*3/uL (ref 4.0–10.5)

## 2015-05-08 LAB — BASIC METABOLIC PANEL
Anion gap: 7 (ref 5–15)
CALCIUM: 7.6 mg/dL — AB (ref 8.9–10.3)
CO2: 25 mmol/L (ref 22–32)
Chloride: 105 mmol/L (ref 101–111)
Creatinine, Ser: 0.65 mg/dL (ref 0.44–1.00)
GFR calc Af Amer: >60 mL/min (ref >60)
GLUCOSE: 104 mg/dL — AB (ref 65–99)
Potassium: 2.7 mmol/L — CL (ref 3.5–5.1)
SODIUM: 137 mmol/L (ref 135–145)

## 2015-05-08 LAB — PROTEIN / CREATININE RATIO, URINE
CREATININE, URINE: 13 mg/dL
PROTEIN CREATININE RATIO: 0.54 mg/mg{creat} — AB (ref 0.00–0.15)
Total Protein, Urine: 7 mg/dL

## 2015-05-08 LAB — RAPID URINE DRUG SCREEN, HOSP PERFORMED
Amphetamines: NOT DETECTED
BARBITURATES: NOT DETECTED
BENZODIAZEPINES: POSITIVE — AB
COCAINE: NOT DETECTED
Opiates: NOT DETECTED
Tetrahydrocannabinol: POSITIVE — AB

## 2015-05-08 LAB — HEPATIC FUNCTION PANEL
ALK PHOS: 120 U/L (ref 38–126)
ALT: 42 U/L (ref 14–54)
AST: 70 U/L — AB (ref 15–41)
Albumin: 2.1 g/dL — ABNORMAL LOW (ref 3.5–5.0)
BILIRUBIN INDIRECT: 0.5 mg/dL (ref 0.3–0.9)
Bilirubin, Direct: 0.2 mg/dL (ref 0.1–0.5)
TOTAL PROTEIN: 4.8 g/dL — AB (ref 6.5–8.1)
Total Bilirubin: 0.7 mg/dL (ref 0.3–1.2)

## 2015-05-08 LAB — CULTURE, OB URINE

## 2015-05-08 LAB — LACTATE DEHYDROGENASE: LDH: 142 U/L (ref 98–192)

## 2015-05-08 LAB — URIC ACID: Uric Acid, Serum: 3 mg/dL (ref 2.3–6.6)

## 2015-05-08 MED ORDER — POTASSIUM CHLORIDE 10 MEQ/100ML IV SOLN
10.0000 meq | INTRAVENOUS | Status: AC
  Administered 2015-05-08 (×6): 10 meq via INTRAVENOUS
  Filled 2015-05-08 (×6): qty 100

## 2015-05-08 MED ORDER — BETAMETHASONE SOD PHOS & ACET 6 (3-3) MG/ML IJ SUSP
12.5000 mg | INTRAMUSCULAR | Status: AC
  Administered 2015-05-08 – 2015-05-09 (×2): 12.5 mg via INTRAMUSCULAR
  Filled 2015-05-08 (×3): qty 2.1

## 2015-05-08 NOTE — Progress Notes (Signed)
In to see patient to review results of [place] and labs, and discuss plan of care.  Patient very sleepy, but arousable. Husband and daughter at bedside. Denies HA, visual sx, or epigastric pain.  Denies N/V today.  Still having mostly liquids.  Became very tearful during discussion of dx of pre-eclampsia and need for continued hospitalization, recommendation for betamethasone course. Patient agrees to administration of betamethasone. Denies questions regarding plan of care--husband also denies any questions regarding plan.  CBC Latest Ref Rng 05/08/2015 05/06/2015 05/27/2012  WBC 4.0 - 10.5 K/uL 9.4 13.5(H) 16.4(H)  Hemoglobin 12.0 - 15.0 g/dL 1.6(X) 11.0(L) 7.6(L)  Hematocrit 36.0 - 46.0 % 28.8(L) 32.9(L) 23.8(L)  Platelets 150 - 400 K/uL 189 273 187   CMP Latest Ref Rng 05/08/2015 05/07/2015 05/06/2015  Glucose 65 - 99 mg/dL 096(E) 454(U) 981(X)  BUN 6 - 20 mg/dL <9(J) <4(N) 6  Creatinine 0.44 - 1.00 mg/dL 8.29 5.62 1.30  Sodium 135 - 145 mmol/L 137 137 134(L)  Potassium 3.5 - 5.1 mmol/L 2.7(LL) 2.4(LL) 2.3(LL)  Chloride 101 - 111 mmol/L 105 99(L) 92(L)  CO2 22 - 32 mmol/L Calcium 8.9 - 10.3 mg/dL 7.6(L) 8.0(L) 8.7(L)  Total Protein 6.5 - 8.1 g/dL 4.8(L) 4.9(L) 5.8(L)  Total Bilirubin 0.3 - 1.2 mg/dL 0.7 1.1 8.6(V)  Alkaline Phos 38 - 126 U/L 120 126 144(H)  AST 15 - 41 U/L 70(H) 47(H) 61(H)  ALT 14 - 54 U/L 42 30 31   Uric acid 3.0 LDH 142 PCR 0.54  UDS: + benzodiazepines, +THC Negative amphetamines, barbiturates, opiates, cocaine  [place]: Vtx, EFW 3+9, 23%ile, lag in HC, FL, and humerus.  AFI 11.05, 25%ile. BPP 6/8, with reactive NST = 8/10 Normal dopplers  FHR Category 1 UCs none  Assessment: IUP at 32 4/7 weeks N/V Pre-eclampsia with severe features (elevated LFT nearly 2x normal) Bipolar/anxiety--on Lamictal and Xanax  Plan: Per consult with Dr. Estanislado Pandy: Betamethasone course Magnesium sulfate in labor Consult with MFM tomorrow regarding timing of delivery, use  of Xanax during pregnancy. Repeat PIH labs in am. Social work consult due to bipolar/anxiety/depression and possible long-term hospitalization due to pre-eclampsia.  Nigel Bridgeman, CNM 05/08/15 3:05p

## 2015-05-08 NOTE — Progress Notes (Signed)
Hospital day # 1 pregnancy at [redacted]w[redacted]d--N/V, oligohydramnios, elevated dopplers, IUGR vs SGA, elevated BP, bipolar, hypokalemia  S:  Just now waking up--still drowsy.  Denies HA, visual sx, or epigastric pain.      Perception of contractions: none      Vaginal bleeding: None       Vaginal discharge:  None         Ate 2 bites of bagel yesterday, nausea afterwards.  Has tolerated clear liquids/Italian ice overnight.       Just requested Zofran IV at 0831.        Dietician reviewed patient status yesterday--recommended Boost, has not tried yet.  O: BP 145/84 mmHg  Pulse 61  Temp(Src) 98.3 F (36.8 C) (Oral)  Resp 18  Ht  (1.448 m)  Wt 38.102 kg (84 lb)  BMI 18.17 kg/m2  SpO2 97%   Filed Vitals:   05/07/15 1558 05/07/15 1940 05/07/15 1942 05/08/15 0823  BP: 144/94 131/95 137/93 145/84  Pulse: 71 80 86 61  Temp: 98.1 F (36.7 C) 98.3 F (36.8 C)  98.3 F (36.8 C)  TempSrc: Oral Axillary  Oral  Resp: Height:      Weight:      SpO2:            Fetal tracings:  Category 1      Contractions:   None      Uterus non-tender      Extremities: no significant edema and no signs of DVT          Labs:   Results for orders placed or performed during the hospital encounter of 05/06/15 (from the past 24 hour(s))  Basic metabolic panel     Status: Abnormal   Collection Time: 05/08/15  6:20 AM  Result Value Ref Range   Sodium 137 135 - 145 mmol/L   Potassium 2.7 (LL) 3.5 - 5.1 mmol/L   Chloride 105 101 - 111 mmol/L   CO2 25 22 - 32 mmol/L   Glucose, Bld 104 (H) 65 - 99 mg/dL   BUN <5 (L) 6 - 20 mg/dL   Creatinine, Ser 5.28 0.44 - 1.00 mg/dL   Calcium 7.6 (L) 8.9 - 10.3 mg/dL   GFR calc non Af Amer >60 >60 mL/min   GFR calc Af Amer >60 >60 mL/min   Anion gap 7 5 - 15   AST 47 yesterday.  Received 3 runs of K+ yesterday, 2 runs the previous night.       Meds:  . ALPRAZolam  1 mg Oral TID  . docusate sodium  100 mg Oral Daily  . feeding supplement  1 Container  Oral TID BM  . lamoTRIgine  50 mg Oral Daily  . potassium chloride  10 mEq Intravenous Q1 Hr x 6    A: [redacted]w[redacted]d with N/V, oligohydramnios, IUGR vs SGA, elevated dopplers, elevated BP overnight, bipolar, hypokalemia     Unchanged  P: Continue current plan of care      [place] for growth, dopplers, BPP today      Consulted with Dr. Normand Sloop      6 runs of K+ today      PIH labs      PCR      UDS      MDs will follow  Nigel Bridgeman CNM, [place] 05/08/2015 8:50 AM

## 2015-05-09 ENCOUNTER — Inpatient Hospital Stay (HOSPITAL_COMMUNITY): Payer: Medicaid Other

## 2015-05-09 LAB — COMPREHENSIVE METABOLIC PANEL
ALT: 39 U/L (ref 14–54)
AST: 50 U/L — AB (ref 15–41)
Albumin: 2.2 g/dL — ABNORMAL LOW (ref 3.5–5.0)
Alkaline Phosphatase: 128 U/L — ABNORMAL HIGH (ref 38–126)
Anion gap: 6 (ref 5–15)
CHLORIDE: 108 mmol/L (ref 101–111)
CO2: 21 mmol/L — AB (ref 22–32)
CREATININE: 0.57 mg/dL (ref 0.44–1.00)
Calcium: 7.7 mg/dL — ABNORMAL LOW (ref 8.9–10.3)
GFR calc Af Amer: >60 mL/min (ref >60)
GFR calc non Af Amer: >60 mL/min (ref >60)
Glucose, Bld: 117 mg/dL — ABNORMAL HIGH (ref 65–99)
POTASSIUM: 3.6 mmol/L (ref 3.5–5.1)
SODIUM: 135 mmol/L (ref 135–145)
Total Bilirubin: 0.5 mg/dL (ref 0.3–1.2)
Total Protein: 4.8 g/dL — ABNORMAL LOW (ref 6.5–8.1)

## 2015-05-09 LAB — CBC
HCT: 28.3 % — ABNORMAL LOW (ref 36.0–46.0)
Hemoglobin: 9.3 g/dL — ABNORMAL LOW (ref 12.0–15.0)
MCH: 29.9 pg (ref 26.0–34.0)
MCHC: 32.9 g/dL (ref 30.0–36.0)
MCV: 91 fL (ref 78.0–100.0)
PLATELETS: 182 10*3/uL (ref 150–400)
RBC: 3.11 MIL/uL — AB (ref 3.87–5.11)
RDW: 13.8 % (ref 11.5–15.5)
WBC: 8.5 10*3/uL (ref 4.0–10.5)

## 2015-05-09 LAB — URIC ACID: URIC ACID, SERUM: 2.2 mg/dL — AB (ref 2.3–6.6)

## 2015-05-09 LAB — LACTATE DEHYDROGENASE: LDH: 132 U/L (ref 98–192)

## 2015-05-09 MED ORDER — PRENATAL MULTIVITAMIN CH
1.0000 | ORAL_TABLET | Freq: Every day | ORAL | Status: DC
  Administered 2015-05-09 – 2015-05-11 (×3): 1 via ORAL
  Filled 2015-05-09 (×3): qty 1

## 2015-05-09 MED ORDER — LACTATED RINGERS IV BOLUS (SEPSIS)
500.0000 mL | Freq: Once | INTRAVENOUS | Status: AC
  Administered 2015-05-09: 500 mL via INTRAVENOUS

## 2015-05-09 NOTE — Progress Notes (Addendum)
Antepartum LOS: 2 Kristi Perkins, 29 y.o.,   OB History    Gravida Para Term Preterm AB TAB SAB Ectopic Multiple Living   3 1 1  0 1 1 0 0 0 1      Subjective -Patient tearful in bed. Reports "it is him" and pointing to FOB when asked what was bothering her.  Otherwise patient denies all issues including HA, N/V, visual disturbances, LoF, VB, and ctx.  Patient reports appetite is "okay."  Objective  Filed Vitals:   05/08/15 2200 05/09/15 0405 05/09/15 0932 05/09/15 0940  BP:  116/72 131/79   Pulse:  62 84 84  Temp:   98.4 F (36.9 C)   TempSrc:   Oral   Resp: 16  18   Height:      Weight:      SpO2:    95%    Results for orders placed or performed during the hospital encounter of 05/06/15 (from the past 24 hour(s))  CBC     Status: Abnormal   Collection Time: 05/09/15  5:58 AM  Result Value Ref Range   WBC 8.5 4.0 - 10.5 K/uL   RBC 3.11 (L) 3.87 - 5.11 MIL/uL   Hemoglobin 9.3 (L) 12.0 - 15.0 g/dL   HCT 16.1 (L) 09.6 - 04.5 %   MCV 91.0 78.0 - 100.0 fL   MCH 29.9 26.0 - 34.0 pg   MCHC 32.9 30.0 - 36.0 g/dL   RDW 40.9 81.1 - 91.4 %   Platelets 182 150 - 400 K/uL  Comprehensive metabolic panel     Status: Abnormal   Collection Time: 05/09/15  5:58 AM  Result Value Ref Range   Sodium 135 135 - 145 mmol/L   Potassium 3.6 3.5 - 5.1 mmol/L   Chloride 108 101 - 111 mmol/L   CO2 21 (L) 22 - 32 mmol/L   Glucose, Bld 117 (H) 65 - 99 mg/dL   BUN <5 (L) 6 - 20 mg/dL   Creatinine, Ser 7.82 0.44 - 1.00 mg/dL   Calcium 7.7 (L) 8.9 - 10.3 mg/dL   Total Protein 4.8 (L) 6.5 - 8.1 g/dL   Albumin 2.2 (L) 3.5 - 5.0 g/dL   AST 50 (H) 15 - 41 U/L   ALT 39 14 - 54 U/L   Alkaline Phosphatase 128 (H) 38 - 126 U/L   Total Bilirubin 0.5 0.3 - 1.2 mg/dL   GFR calc non Af Amer >60 >60 mL/min   GFR calc Af Amer >60 >60 mL/min   Anion gap 6 5 - 15  Lactate dehydrogenase     Status: None   Collection Time: 05/09/15  5:58 AM  Result Value Ref Range   LDH 132 98 - 192 U/L   Uric acid     Status: Abnormal   Collection Time: 05/09/15  5:58 AM  Result Value Ref Range   Uric Acid, Serum 2.2 (L) 2.3 - 6.6 mg/dL    Meds: Scheduled Meds: . ALPRAZolam  1 mg Oral TID  . betamethasone acetate-betamethasone sodium phosphate  12.5 mg Intramuscular Q24 Hr x 2  . docusate sodium  100 mg Oral Daily  . feeding supplement  1 Container Oral TID BM  . lamoTRIgine  50 mg Oral Daily   Continuous Infusions: . dextrose 5% lactated ringers 125 mL/hr at 05/09/15 0509   PRN Meds:.ondansetron (ZOFRAN) IV, zolpidem   Physical Exam  Constitutional: She is oriented to person, place, and time. She appears well-developed and well-nourished. She appears distressed.  HENT:  Head: Normocephalic and atraumatic.  Eyes: EOM are normal. Pupils are equal, round, and reactive to light.  Neck: Normal range of motion.  Cardiovascular: Normal rate, regular rhythm and normal heart sounds.   Pulmonary/Chest: Effort normal.  Abdominal: Soft. Bowel sounds are normal. She exhibits no distension. There is no tenderness.  Musculoskeletal: Normal range of motion.  Neurological: She is alert and oriented to person, place, and time.  Skin: Skin is warm and dry.  Psychiatric: Judgment and thought content normal.  :   Monitoring Type:NST Q Shift Time:0940-1012 FHR: 135 bpm, Mod Var, -Decels, +Accels UC: None graphed  Assessment IUP at [redacted]w[redacted]d Reactive NST PreEclampsia with Severe Features for Elevated LFTs BiPolar/Anxiety N/V   Plan SW consult modified to stat MFM Consult for co-mgmt LFTs decreasing--potentially due to drug clearance--will have MFM address during consult 2nd Dose BMZ at 1300 today Patient denies needs/questions or concerns at current FOB denies questions or concerns Continue current plan of care Upcoming Treatments/Tests: Dr.S. Lister Brizzi to follow as appropriate  Kristi Perkins, Kristi LYNN, MSN, CNM 05/09/2015, 10:15 AM   Seen and agreed POC reviewed with patient who is  requesting outpatient management: informed that we are awaiting MFM recommendations SW consult requested

## 2015-05-09 NOTE — Progress Notes (Addendum)
Addendum 10:44pm Primary nurse called to report a change in fetal baseline.  Strip reviewed with Dr Richardson Dopp.  Pt given 500cc bolus and order for AFi and BPP.  Addendum 11:58pm [place] reports FHR 140, AFI 11.05, BPP 6/8 off for breathing,, anterior placenta, cephalic, EFW 3lb 9oz - 23%

## 2015-05-09 NOTE — Clinical SW OB High Risk (Signed)
Clinical Social Work Antenatal   Clinical Social Worker:  Kelby Fam Date/Time:  05/09/2015, 1:24 PM Gestational Age on Admission:  29 y.o. 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  She continues to endorse feeling loss of control since she was recently informed that she may need to remain in the hospital until she delivers the baby. She stated that she is "depressed and anxious" about the thought of needing to remain in the hospital since she feels that she needs to clean the home, prepare for the infant, and care for her daughter.  Previous Pregnancies/Feelings Towards Pregnancy?  Concerns related to being/becoming a mother?:   Patient reported that she is excited about the birth of the infant. No concerns identified.   Social Support (FOB? Who is/will be helping with baby/other kids?):  Patient provided consent for her mother to remain in the room during the visit. The patient stated that she has a strong and supportive relationship with her mother, but felt that her mother cannot help her care for the home and her other daughter as well as previously due to her mother's medical needs. The patient struggled to identify additional members of her support system  that may assist her with preparations for the infant, but did report that she is supposed to have a baby shower in the near future indicating presence of additional supports.   Couples Relationship (describe):  The patient stated that she is on the receiving end of negative comments from the FOB.  She did not identify specific comments; however, discussed that it is contributing to her stress level.  FOB then returned to the room, and CSW was unable to further evaluate.  = Recent Stressful Life Events (life changes in past year?):  None identified.   Prenatal Care/Education/Home Preparations: Patient reported that the home is not cleaned and prepared in the ways in which she would prefer. She stated that she has an upcoming baby shower.    Domestic Violence (of any type):  No If Yes to Domestic Violence, Describe/Action Plan:  N/A  Substance Use During Pregnancy: No (If Yes, Complete SBIRT)  Complete PHQ-9 (Depresssion Screening) on all Antenatal Patients PHQ-9 Score (If Score => 15 complete TREAT):  Did not complete. Will complete during follow up visit.    Follow-up Recommendations:   CSW encouraged patient to identify positive aspects and areas of gratitude sine she is hyper-focused on hearing negative news related to her health.   CSW encouraged patient and family to identify and explore what is within patient's control since she is feeling out of control.   CSW to follow up when there are fewer family members in the room in order to continue to complete assessment and to assist her to developing emotional regulation skills to assist her to cope with ongoing admission  and mental health symptoms.   Patient Response:    Patient presented as emotional and tearful during the assessment. She required guidance in order to identify positive components of her day, and potential benefits of need to stay hospitalized.  Patient struggled to identify emotional regulation skills to reduce her  anxiety, but presented as receptive to ongoing CSW interventions to assist her to develop skills.   Clinical Assessment/Plan:    CSW received request for consult due to patient presenting with a history of depression, anxiety, and bipolar. Patient reported that she was diagnosed with bipolar and anxiety, and was prescribed Xanax, Lamictal, and Lithium by her PCP. She stated that she was advised by her PCP to discontinue Lamictal and Lithium with the +UPT, and shared that it was a difficult transition for her when medications were discontinued.  Patient shared that she was recently restarted on Lamictal, and was referred to the [facility].  Patient presents as interested and receptive to therapy, since she notes that it is difficult for her to self-regulate when she becomes anxious and overwhelmed.  Patient endorsed feelings of anxiety and depression secondary to hospital admission since she is feeling a loss of control.  The patient's mother stated that that the patient has a difficult time when she feels loss of control.  Patient was noted to be emotional and tearful, and due to emotional state, it was difficult for her to engage in cognitive restructuring.  Efforts were made to assist her to de-catastrophize worse case scenarios and to identify ways to change her thoughts about the admission to improve her mood.  Patient's mother attempted to support patient by helping her to take it "one day at a time", but patient is hyper-focused on potential length of stay and how long it feels to her. Patient was unable to identify effective emotional regulation skills, but was receptive to ongoing CSW interventions to assist her to develop skills.  Patient and family unable to identify additional interventions that may assist the patient.

## 2015-05-10 ENCOUNTER — Telehealth: Payer: Self-pay | Admitting: Obstetrics and Gynecology

## 2015-05-10 ENCOUNTER — Inpatient Hospital Stay (HOSPITAL_COMMUNITY): Payer: Medicaid Other

## 2015-05-10 ENCOUNTER — Encounter: Payer: Self-pay | Admitting: Obstetrics and Gynecology

## 2015-05-10 DIAGNOSIS — B951 Streptococcus, group B, as the cause of diseases classified elsewhere: Secondary | ICD-10-CM | POA: Insufficient documentation

## 2015-05-10 LAB — CBC
HCT: 25.6 % — ABNORMAL LOW (ref 36.0–46.0)
HEMOGLOBIN: 8.4 g/dL — AB (ref 12.0–15.0)
MCH: 29.9 pg (ref 26.0–34.0)
MCHC: 32.8 g/dL (ref 30.0–36.0)
MCV: 91.1 fL (ref 78.0–100.0)
Platelets: 177 10*3/uL (ref 150–400)
RBC: 2.81 MIL/uL — AB (ref 3.87–5.11)
RDW: 13.8 % (ref 11.5–15.5)
WBC: 10.6 10*3/uL — ABNORMAL HIGH (ref 4.0–10.5)

## 2015-05-10 LAB — COMPREHENSIVE METABOLIC PANEL
ALK PHOS: 113 U/L (ref 38–126)
ALT: 33 U/L (ref 14–54)
AST: 40 U/L (ref 15–41)
Albumin: 2 g/dL — ABNORMAL LOW (ref 3.5–5.0)
Anion gap: 10 (ref 5–15)
CALCIUM: 7.5 mg/dL — AB (ref 8.9–10.3)
CO2: 20 mmol/L — AB (ref 22–32)
CREATININE: 0.64 mg/dL (ref 0.44–1.00)
Chloride: 107 mmol/L (ref 101–111)
GFR calc non Af Amer: >60 mL/min (ref >60)
Glucose, Bld: 108 mg/dL — ABNORMAL HIGH (ref 65–99)
Potassium: 3.2 mmol/L — ABNORMAL LOW (ref 3.5–5.1)
SODIUM: 137 mmol/L (ref 135–145)
Total Bilirubin: 0.3 mg/dL (ref 0.3–1.2)
Total Protein: 4.2 g/dL — ABNORMAL LOW (ref 6.5–8.1)

## 2015-05-10 LAB — URIC ACID: Uric Acid, Serum: 2.1 mg/dL — ABNORMAL LOW (ref 2.3–6.6)

## 2015-05-10 MED ORDER — POTASSIUM CHLORIDE CRYS ER 20 MEQ PO TBCR
20.0000 meq | EXTENDED_RELEASE_TABLET | Freq: Two times a day (BID) | ORAL | Status: DC
  Administered 2015-05-10 – 2015-05-11 (×2): 20 meq via ORAL
  Filled 2015-05-10 (×4): qty 1

## 2015-05-10 NOTE — Consult Note (Signed)
Maternal Fetal Medicine Consultation  Requesting Provider(s): Silverio Lay, MD  Reason for consultation: Preeclampsia, elevated LFTS (now resolved), poor nutrition  HPI: Kristi Perkins is a 29 year-old G3P1011, EDD 06/29/2015 who is currently at [redacted]w[redacted]d seen for consultation due preeclampsia.  The patient was admitted on 9 September due to nausea / vomiting and low potassium. Admission labs showed mildly elevated LFTs that have since improved and normalized. Ultrasound at the time of admission showed decreased amniotic fluid volume and elevated S/D ratio on UA Doppler studies. Over the next 24-48 hours, the patient was noted to have some mild range blood pressures (140's/90's).  A urine protein/creatinine ratio of 0.54 was noted. A 24-hr urine protein collection has not been completed.  The patient has now completed a course of betamethasone.  Repeat ultrasound on 9/11 showed normal UA Doppler studies, normal AFI and an estimated fetal weight at the 23rd %tile.  [name] is without complaints today.  She denies HA, visual changes or RUQ pain.  Her nausea and vomiting has improved and her diet has been advanced.  OB History: OB History    Gravida Para Term Preterm AB TAB SAB Ectopic Multiple Living   0 1 1 0 0 0 1      PMH:  Past Medical History  Diagnosis Date  . Hernia   . Anxiety     on xanax at beginning of preg   . FH: anemia     Father  . FH: Parkinson's disease     Father  . UTI (urinary tract infection)      x1  . H/O varicella   . FH: heart disease   . FH: diabetes mellitus   . FH: migraines   . FH: kidney disease   . FH: cancer   . FH: liver disease   . Low iron   . Hx of ovarian cyst   . H/O cocaine abuse 2006     had rehab   . Depression     bipolar    PSH: History reviewed. No pertinent past surgical history.   Meds:  Scheduled Meds: . ALPRAZolam  1 mg Oral TID  . docusate sodium  100 mg Oral Daily  . feeding supplement  1  Container Oral TID BM  . lamoTRIgine  50 mg Oral Daily  . prenatal multivitamin  1 tablet Oral Q1200   Continuous Infusions: . dextrose 5% lactated ringers 125 mL/hr at 05/09/15 2158   PRN Meds:.ondansetron (ZOFRAN) IV, zolpidem   Allergies: No Known Allergies   FH:  Family History  Problem Relation Age of Onset  . Heart disease Father   . Hypertension Father   . Diabetes Father   . Kidney disease Father   . Depression Father   . Alcohol abuse Father   . Cancer Maternal Grandmother   . Kidney disease Paternal Grandfather    Soc:  Social History   Social History  . Marital Status: Married    Spouse Name: N/A  . Number of Children: N/A  . Years of Education: N/A   Occupational History  . Not on file.   Social History Main Topics  . Smoking status: Former Games developer  . Smokeless tobacco: Never Used  . Alcohol Use: No     Comment: 6 pack/ day  . Drug Use: No  . Sexual Activity: Yes     Comment: currently pregnant   Other Topics Concern  . Not on file   Social History Narrative  Review of Systems: no vaginal bleeding or cramping/contractions, no LOF, no nausea/vomiting. All other systems reviewed and are negative.  PE:   Filed Vitals:   05/09/15 2316  BP: 119/82  Pulse: 75  Temp:   Resp: 18    GEN: well-appearing female ABD: gravid, NT  Ultrasound: Single IUP at 32w 6d Size < dates; EFW at the 23rd %tile on 9/11 BPP 8/8 Normal amniotic fluid volume  Labs: CBC    Component Value Date/Time   WBC 10.6* 05/10/2015 0525   RBC 2.81* 05/10/2015 0525   HGB 8.4* 05/10/2015 0525   HCT 25.6* 05/10/2015 0525   PLT 177 05/10/2015 0525   MCV 91.1 05/10/2015 0525   MCH 29.9 05/10/2015 0525   MCHC 32.8 05/10/2015 0525   RDW 13.8 05/10/2015 0525   CMP     Component Value Date/Time   NA 137 05/10/2015 0525   K 3.2* 05/10/2015 0525   CL 107 05/10/2015 0525   CO2 20* 05/10/2015 0525   GLUCOSE 108* 05/10/2015 0525   BUN <5* 05/10/2015 0525   CREATININE  0.64 05/10/2015 0525   CALCIUM 7.5* 05/10/2015 0525   PROT 4.2* 05/10/2015 0525   ALBUMIN 2.0* 05/10/2015 0525   AST 40 05/10/2015 0525   ALT 33 05/10/2015 0525   ALKPHOS 113 05/10/2015 0525   BILITOT 0.3 05/10/2015 0525   GFRNONAA >60 05/10/2015 0525   GFRAA >60 05/10/2015 0525   Urine protein/Creatinine ratio: 0.54  A/P: 1) Single IUP at 32w 6d  2) Possible preeclampsia - Kristi Perkins presents with an atypical picture for preeclampsia.  Since admission, she has had some mild range blood pressures - but for the most part BPs have been normal.  Her LFTs were initially elevated and have trended downward - now within normal range.  She had a urine protein/creatinine ratio of 0.54 but urine dip was negative for proteinuria.  Given these findings, would recommend checking a 24-hr urine protein to confirm this diagnosis.  With normal LFTs, I would be hesitant to move toward delivery at 34 weeks.    3) Size < Dates - ultrasound on 9/11 showed an estimated fetal weight at the 23rd %tile with a normal AFI and normal UA Doppler studies.  At the time of admission, her UA Doppler studies were elevated.  While her most recent testing is reassuring, would recommend continued antenatal testing (2x weekly NSTS) with weekly UA Doppler studies and AFIs.  The improved UA Doppler studies may be due to betamethasone.  4) Nausea / vomiting - now improved with IV hydration and antiemetics  5) Anxiety/ depression/ bipolar disorder - currently followed by mental health  Recommendations: 1) Please check 24-hr urine protein 2) Repeat CMP and preeclampsia labs in the morning.  If the LFTs remain normal, feel that the elevation is very unlikely to be due to preeclampsia. 3) If labs, BPs stable over the next 24-48 hours, would consider hospital discharge with close outpatient follow up. 4) 2x weekly NSTs with weekly AFIs and UA Doppler studies as an outpatient 5) Ultrasound for growth in 2/12 - 3 weeks 6) If  discharged, recommend weekly preeclampsia labs and 2x weekly BP checks 7) Based on mild range blood pressures - the patient at least meets criteria for preeclampsia with mild features and would recommend delivery at 37 weeks.  Would deliver sooner (anytime after 34 weeks) if the patient develops preeclampsia with severe features (LFTs > 2x normal on repeat labs, thrombocytopenia < 100K, Creat > 1.2, severe range  blood pressures- SBP>160 or DBP >110, severe headaches)    Thank you for the opportunity to be a part of the care of Kristi Perkins. Please contact our office if we can be of further assistance.   I spent approximately 30 minutes with this patient with over 50% of time spent in face-to-face counseling.  Alpha Gula, MD Maternal Fetal Medicine

## 2015-05-10 NOTE — Progress Notes (Signed)
Kristi Perkins [id number]  Subjective: Nurse call and requests provider review strip. Strip and Chart Reviewed. Questionable baseline change vs prolonged deceleration.  Objective:  Filed Vitals:   05/10/15 1807 05/10/15 1940 05/10/15 2045 05/10/15 2126  BP: 137/91 142/95    Pulse: 90 80    Temp:  98.2 F (36.8 C)    TempSrc:  Oral    Resp: Height:      Weight:      SpO2:        FHR: 125 initially with BL change to105 bpm, Mod Var, -Decels, +Accels UC: None Graphed  Assessment: IUP at [redacted]w[redacted]d Cat I FT PreEclampsia  Plan: -Nurse instructed to leave on for 10 additional minutes to confirm baseline change -NST reactive -Continue other mgmt as ordered  Sabas Sous, CNM 05/10/2015 10:10 PM

## 2015-05-10 NOTE — Telephone Encounter (Signed)
GBS positive, patient's provider aware

## 2015-05-10 NOTE — Progress Notes (Addendum)
Hospital day # 3 pregnancy at [redacted]w[redacted]d-- N/V, hypokalemia, oligohydramnios, pre-eclampsia,  Bipolar/depression/anxiety.     S:  Affect improved, Sitting up in bed, denies N/V. Pt more hopeful that discharge may occur in 24- 48 hr per      MFM recommendations.       Perception of contractions: none      Vaginal bleeding: none now and none       Vaginal discharge:  none  O: BP 138/72 mmHg  Pulse 89  Temp(Src) 98.8 F (37.1 C) (Oral)  Resp 18  Ht 4\' 9"  (1.448 m)  Wt 38.102 kg (84 lb)  BMI 18.17 kg/m2  SpO2 96%      Fetal tracings:  Cat 1, baseline 110-120 , moderate variability with broad accels, no decels      Contractions:   irritability       Uterus gravid and non-tender      Extremities: no significant edema and no signs of DVT          Labs:   Results for orders placed or performed during the hospital encounter of 05/06/15 (from the past 24 hour(s))  CBC     Status: Abnormal   Collection Time: 05/10/15  5:25 AM  Result Value Ref Range   WBC 10.6 (H) 4.0 - 10.5 K/uL   RBC 2.81 (L) 3.87 - 5.11 MIL/uL   Hemoglobin 8.4 (L) 12.0 - 15.0 g/dL   HCT 09.8 (L) 11.9 - 14.7 %   MCV 91.1 78.0 - 100.0 fL   MCH 29.9 26.0 - 34.0 pg   MCHC 32.8 30.0 - 36.0 g/dL   RDW 82.9 56.2 - 13.0 %   Platelets 177 150 - 400 K/uL  Comprehensive metabolic panel     Status: Abnormal   Collection Time: 05/10/15  5:25 AM  Result Value Ref Range   Sodium 137 135 - 145 mmol/L   Potassium 3.2 (L) 3.5 - 5.1 mmol/L   Chloride 107 101 - 111 mmol/L   CO2 20 (L) 22 - 32 mmol/L   Glucose, Bld 108 (H) 65 - 99 mg/dL   BUN <5 (L) 6 - 20 mg/dL   Creatinine, Ser 8.65 0.44 - 1.00 mg/dL   Calcium 7.5 (L) 8.9 - 10.3 mg/dL   Total Protein 4.2 (L) 6.5 - 8.1 g/dL   Albumin 2.0 (L) 3.5 - 5.0 g/dL   AST 40 15 - 41 U/L   ALT 33 14 - 54 U/L   Alkaline Phosphatase 113 38 - 126 U/L   Total Bilirubin 0.3 0.3 - 1.2 mg/dL   GFR calc non Af Amer >60 >60 mL/min   GFR calc Af Amer >60 >60 mL/min   Anion gap 10 5 - 15  Uric acid      Status: Abnormal   Collection Time: 05/10/15  5:25 AM  Result Value Ref Range   Uric Acid, Serum 2.1 (L) 2.3 - 6.6 mg/dL         Meds:  . ALPRAZolam  1 mg Oral TID  . docusate sodium  100 mg Oral Daily  . feeding supplement  1 Container Oral TID BM  . lamoTRIgine  50 mg Oral Daily  . prenatal multivitamin  1 tablet Oral Q1200    A: [redacted]w[redacted]d with N/V - resolved     Hypokalemia - improving     Pre-eclampsia      Oligohydramnios- resovled     Bipolar/depression/anxiety     Anemia     Stable   P:  Continue current plan of care      Upcoming tests/treatments:  Saline lock IV, 24 hr urine, PIH labs in am      Consider FE Rx at discharge      MDs will follow  MFM Consults Recommendations: 1) Please check 24-hr urine protein 2) Repeat CMP and preeclampsia labs in the morning. If the LFTs remain normal, feel that the elevation is very unlikely to be due to preeclampsia. 3) If labs, BPs stable over the next 24-48 hours, would consider hospital discharge with close outpatient follow up. 4) 2x weekly NSTs with weekly AFIs and UA Doppler studies as an outpatient 5) Ultrasound for growth in 2/12 - 3 weeks 6) If discharged, recommend weekly preeclampsia labs and 2x weekly BP checks 7) Based on mild range blood pressures - the patient at least meets criteria for preeclampsia with mild features and would recommend delivery at 37 weeks. Would deliver sooner (anytime after 34 weeks) if the patient develops preeclampsia with severe features (LFTs > 2x normal on repeat labs, thrombocytopenia < 100K, Creat > 1.2, severe range blood pressures- SBP>160 or DBP >110, severe headaches)   Kristi Perkins CNM, [place] 05/10/2015 1:34 PM   Addendum:  Per consult with Dr. Normand Sloop give Kdur 20 mEq BID  Kristi Perkins CNM, [place] 05/10/15  1548

## 2015-05-11 ENCOUNTER — Inpatient Hospital Stay (HOSPITAL_COMMUNITY): Admission: RE | Admit: 2015-05-11 | Payer: Medicaid Other | Source: Ambulatory Visit

## 2015-05-11 LAB — COMPREHENSIVE METABOLIC PANEL
ALBUMIN: 2.1 g/dL — AB (ref 3.5–5.0)
ALK PHOS: 106 U/L (ref 38–126)
ALT: 29 U/L (ref 14–54)
AST: 31 U/L (ref 15–41)
Anion gap: 8 (ref 5–15)
BILIRUBIN TOTAL: 0.4 mg/dL (ref 0.3–1.2)
BUN: 6 mg/dL (ref 6–20)
CALCIUM: 7.7 mg/dL — AB (ref 8.9–10.3)
CO2: 23 mmol/L (ref 22–32)
CREATININE: 0.57 mg/dL (ref 0.44–1.00)
Chloride: 105 mmol/L (ref 101–111)
GFR calc Af Amer: >60 mL/min (ref >60)
GLUCOSE: 75 mg/dL (ref 65–99)
Potassium: 3.3 mmol/L — ABNORMAL LOW (ref 3.5–5.1)
Sodium: 136 mmol/L (ref 135–145)
TOTAL PROTEIN: 4.5 g/dL — AB (ref 6.5–8.1)

## 2015-05-11 LAB — CBC
HEMATOCRIT: 25.9 % — AB (ref 36.0–46.0)
HEMOGLOBIN: 8.6 g/dL — AB (ref 12.0–15.0)
MCH: 30.2 pg (ref 26.0–34.0)
MCHC: 33.2 g/dL (ref 30.0–36.0)
MCV: 90.9 fL (ref 78.0–100.0)
Platelets: 169 10*3/uL (ref 150–400)
RBC: 2.85 MIL/uL — AB (ref 3.87–5.11)
RDW: 13.9 % (ref 11.5–15.5)
WBC: 8.5 10*3/uL (ref 4.0–10.5)

## 2015-05-11 LAB — URIC ACID: Uric Acid, Serum: 2.2 mg/dL — ABNORMAL LOW (ref 2.3–6.6)

## 2015-05-11 LAB — LACTATE DEHYDROGENASE: LDH: 136 U/L (ref 98–192)

## 2015-05-11 LAB — PROTEIN, URINE, 24 HOUR
Collection Interval-UPROT: 24 hours
Protein, 24H Urine: 253 mg/d — ABNORMAL HIGH (ref 50–100)
Protein, Urine: 10 mg/dL
URINE TOTAL VOLUME-UPROT: 2525 mL

## 2015-05-11 LAB — OB RESULTS CONSOLE GBS: GBS: POSITIVE

## 2015-05-11 MED ORDER — SODIUM CHLORIDE 0.9 % IJ SOLN
3.0000 mL | Freq: Two times a day (BID) | INTRAMUSCULAR | Status: DC
  Administered 2015-05-11: 3 mL via INTRAVENOUS

## 2015-05-11 MED ORDER — POTASSIUM CHLORIDE CRYS ER 20 MEQ PO TBCR: 20.0000 meq | EXTENDED_RELEASE_TABLET | Freq: Every day | ORAL | Status: DC

## 2015-05-11 MED ORDER — POTASSIUM CHLORIDE CRYS ER 20 MEQ PO TBCR
20.0000 meq | EXTENDED_RELEASE_TABLET | ORAL | Status: AC
  Administered 2015-05-11: 20 meq via ORAL
  Filled 2015-05-11: qty 1

## 2015-05-11 NOTE — Discharge Instructions (Signed)
Fetal Movement Counts °Patient Name: __________________________________________________ Patient Due Date: ____________________ °Performing a fetal movement count is highly recommended in high-risk pregnancies, but it is good for every pregnant woman to do. Your health care provider may ask you to start counting fetal movements at 28 weeks of the pregnancy. Fetal movements often increase: °· After eating a full meal. °· After physical activity. °· After eating or drinking something sweet or cold. °· At rest. °Pay attention to when you feel the baby is most active. This will help you notice a pattern of your baby's sleep and wake cycles and what factors contribute to an increase in fetal movement. It is important to perform a fetal movement count at the same time each day when your baby is normally most active.  °HOW TO COUNT FETAL MOVEMENTS °1. Find a quiet and comfortable area to sit or lie down on your left side. Lying on your left side provides the best blood and oxygen circulation to your baby. °2. Write down the day and time on a sheet of paper or in a journal. °3. Start counting kicks, flutters, swishes, rolls, or jabs in a 2-hour period. You should feel at least 10 movements within 2 hours. °4. If you do not feel 10 movements in 2 hours, wait 2-3 hours and count again. Look for a change in the pattern or not enough counts in 2 hours. °SEEK MEDICAL CARE IF: °· You feel less than 10 counts in 2 hours, tried twice. °· There is no movement in over an hour. °· The pattern is changing or taking longer each day to reach 10 counts in 2 hours. °· You feel the baby is not moving as he or she usually does. °Date: ____________ Movements: ____________ Start time: ____________ Finish time: ____________  °Date: ____________ Movements: ____________ Start time: ____________ Finish time: ____________ °Date: ____________ Movements: ____________ Start time: ____________ Finish time: ____________ °Date: ____________ Movements:  ____________ Start time: ____________ Finish time: ____________ °Date: ____________ Movements: ____________ Start time: ____________ Finish time: ____________ °Date: ____________ Movements: ____________ Start time: ____________ Finish time: ____________ °Date: ____________ Movements: ____________ Start time: ____________ Finish time: ____________ °Date: ____________ Movements: ____________ Start time: ____________ Finish time: ____________  °Date: ____________ Movements: ____________ Start time: ____________ Finish time: ____________ °Date: ____________ Movements: ____________ Start time: ____________ Finish time: ____________ °Date: ____________ Movements: ____________ Start time: ____________ Finish time: ____________ °Date: ____________ Movements: ____________ Start time: ____________ Finish time: ____________ °Date: ____________ Movements: ____________ Start time: ____________ Finish time: ____________ °Date: ____________ Movements: ____________ Start time: ____________ Finish time: ____________ °Date: ____________ Movements: ____________ Start time: ____________ Finish time: ____________  °Date: ____________ Movements: ____________ Start time: ____________ Finish time: ____________ °Date: ____________ Movements: ____________ Start time: ____________ Finish time: ____________ °Date: ____________ Movements: ____________ Start time: ____________ Finish time: ____________ °Date: ____________ Movements: ____________ Start time: ____________ Finish time: ____________ °Date: ____________ Movements: ____________ Start time: ____________ Finish time: ____________ °Date: ____________ Movements: ____________ Start time: ____________ Finish time: ____________ °Date: ____________ Movements: ____________ Start time: ____________ Finish time: ____________  °Date: ____________ Movements: ____________ Start time: ____________ Finish time: ____________ °Date: ____________ Movements: ____________ Start time: ____________ Finish  time: ____________ °Date: ____________ Movements: ____________ Start time: ____________ Finish time: ____________ °Date: ____________ Movements: ____________ Start time: ____________ Finish time: ____________ °Date: ____________ Movements: ____________ Start time: ____________ Finish time: ____________ °Date: ____________ Movements: ____________ Start time: ____________ Finish time: ____________ °Date: ____________ Movements: ____________ Start time: ____________ Finish time: ____________  °Date: ____________ Movements: ____________ Start time: ____________ Finish   time: ____________ Date: ____________ Movements: ____________ Start time: ____________ Kristi Perkins time: ____________ Date: ____________ Movements: ____________ Start time: ____________ Kristi Perkins time: ____________ Date: ____________ Movements: ____________ Start time: ____________ Kristi Perkins time: ____________ Date: ____________ Movements: ____________ Start time: ____________ Kristi Perkins time: ____________ Date: ____________ Movements: ____________ Start time: ____________ Kristi Perkins time: ____________ Date: ____________ Movements: ____________ Start time: ____________ Kristi Perkins time: ____________  Date: ____________ Movements: ____________ Start time: ____________ Kristi Perkins time: ____________ Date: ____________ Movements: ____________ Start time: ____________ Kristi Perkins time: ____________ Date: ____________ Movements: ____________ Start time: ____________ Kristi Perkins time: ____________ Date: ____________ Movements: ____________ Start time: ____________ Kristi Perkins time: ____________ Date: ____________ Movements: ____________ Start time: ____________ Kristi Perkins time: ____________ Date: ____________ Movements: ____________ Start time: ____________ Kristi Perkins time: ____________ Date: ____________ Movements: ____________ Start time: ____________ Kristi Perkins time: ____________  Date: ____________ Movements: ____________ Start time: ____________ Kristi Perkins time: ____________ Date: ____________  Movements: ____________ Start time: ____________ Kristi Perkins time: ____________ Date: ____________ Movements: ____________ Start time: ____________ Kristi Perkins time: ____________ Date: ____________ Movements: ____________ Start time: ____________ Kristi Perkins time: ____________ Date: ____________ Movements: ____________ Start time: ____________ Kristi Perkins time: ____________ Date: ____________ Movements: ____________ Start time: ____________ Kristi Perkins time: ____________ Date: ____________ Movements: ____________ Start time: ____________ Kristi Perkins time: ____________  Date: ____________ Movements: ____________ Start time: ____________ Kristi Perkins time: ____________ Date: ____________ Movements: ____________ Start time: ____________ Kristi Perkins time: ____________ Date: ____________ Movements: ____________ Start time: ____________ Kristi Perkins time: ____________ Date: ____________ Movements: ____________ Start time: ____________ Kristi Perkins time: ____________ Date: ____________ Movements: ____________ Start time: ____________ Kristi Perkins time: ____________ Date: ____________ Movements: ____________ Start time: ____________ Kristi Perkins time: ____________ Document Released: 09/12/2006 Document Revised: 12/28/2013 Document Reviewed: 06/09/2012 [employer] Patient Information 2015 [employer]. This information is not intended to replace advice given to you by your health care provider. Make sure you discuss any questions you have with your health care provider. Hypertension During Pregnancy Hypertension is also called high blood pressure. Blood pressure moves blood in your body. Sometimes, the force that moves the blood becomes too strong. When you are pregnant, this condition should be watched carefully. It can cause problems for you and your baby. HOME CARE   Make and keep all of your doctor visits.  Take medicine as told by your doctor. Tell your doctor about all medicines you take.  Eat very little salt.  Exercise regularly.  Do not drink  alcohol.  Do not smoke.  Do not have drinks with caffeine.  Lie on your left side when resting.  Your health care provider may ask you to take one low-dose aspirin ( ) each day. GET HELP RIGHT AWAY IF: 5. You have bad belly (abdominal) pain. 6. You have sudden puffiness (swelling) in the hands, ankles, or face. 7. You gain 4 pounds (1.8 kilograms) or more in 1 week. 8. You throw up (vomit) repeatedly. 9. You have bleeding from the vagina. 10. You do not feel the baby moving as much. 11. You have a headache. 12. You have blurred or double vision. 13. You have muscle twitching or spasms. 14. You have shortness of breath. 15. You have blue fingernails and lips. 16. You have blood in your pee (urine). MAKE SURE YOU:  Understand these instructions.  Will watch your condition.  Will get help right away if you are not doing well or get worse. Document Released: 09/15/2010 Document Revised: 12/28/2013 Document Reviewed: 03/12/2013 [facility] Patient Information 2015 [place], [place]. This information is not intended to replace advice given to you by your health care provider. Make sure you discuss any questions you have with your  health care provider. ° °

## 2015-05-11 NOTE — Discharge Summary (Signed)
Physician Discharge Summary  Patient ID: Kristi Perkins MRN: [id number] DOB/AGE: 28/20/1987 28 y.o.  Admit date: 05/06/2015 Discharge date: 05/11/2015  Admission Diagnoses: See below  Discharge Diagnoses:  Principal Problem:   Nausea and vomiting during pregnancy Active Problems:   Bipolar 1 disorder   Hypokalemia   Oligohydramnios   H/O placenta previa   Nausea & vomiting   H/O drug abuse in past   Poor fetal growth--IUGR vs SGA vs constitutionally small   Body mass index (BMI) of 20.0-20.9 in adult   Hernia   Discharged Condition: stable  Hospital Course: Pt wasn admitted with N/V and found to have mild preeclampsia with elevated AST and PCR 0.5.  Please see MFM consult note but they recommend delivery at 37wks for mild preeclampsia and at 34wks for preeclampsia with severe features.  For mild preeclampsia pt could be managed as outpatient and they gave detailed recs for outpatient mgmt.  They recommended a 24 hr urine which had less than  of total protein.  The pt was asymptomatic upon discharge and the day of discharge, the pt wanted to go home.  She received BMZ on 9/11 and 9/12.  She is GBS + per report from bacteriuria.  A urine culture was sent the day of discharge that needs to be followed up in the office.  The pt was scheduled for NST on Friday the 16th with visit and on Tues for u/s with BPP, AFI and dopplers.  I asked Winfred Leeds in triage to arrange u/s with MFM for growth 2wks from 9/16.  Discharge instructions were reviewed with the patient and the patient was discharged home.  Fetal status was reassuring with cat 1 tracing.  Pt had nl labs upon day of discharge and she was discharged home on bedrest.  FKCs discussed.  Pt was also sent home ln [facility] because potassium was slightly low at 3.3.  Consults: MFM  Significant Diagnostic Studies: ultrasound  Treatments: observation, labs, bedrest.  Discharge Exam: Blood pressure 137/91, pulse 97,  temperature 98.1 F (36.7 C), temperature source Oral, resp. rate 20, height  (1.448 m), weight 45.813 kg (101 lb), SpO2 96 %, unknown if currently breastfeeding. General appearance: alert and no distress Lungs CTA  CV RRR Abdomen gravid Ext no calf tenderness, 2-3+ reflexes, no clonus FHT cat 1  Disposition: 01-Home or Self Care  Discharge Instructions    Discharge activity:  No Restrictions    Complete by:  As directed      Discharge diet:  No restrictions    Complete by:  As directed      Discharge instructions    Complete by:  As directed   Per [facility] handout     Fetal Kick Count:  Lie on our left side for one hour after a meal, and count the number of times your baby kicks.  If it is less than 5 times, get up, move around and drink some juice.  Repeat the test 30 minutes later.  If it is still less than 5 kicks in an hour, notify your doctor.    Complete by:  As directed      No sexual activity restrictions    Complete by:  As directed      Notify physician for a general feeling that "something is not right"    Complete by:  As directed      Notify physician for increase or change in vaginal discharge    Complete by:  As directed  Notify physician for intestinal cramps, with or without diarrhea, sometimes described as "gas pain"    Complete by:  As directed      Notify physician for leaking of fluid    Complete by:  As directed      Notify physician for low, dull backache, unrelieved by heat or Tylenol    Complete by:  As directed      Notify physician for menstrual like cramps    Complete by:  As directed      Notify physician for pelvic pressure    Complete by:  As directed      Notify physician for uterine contractions.  These may be painless and feel like the uterus is tightening or the baby is  "balling up"    Complete by:  As directed      Notify physician for vaginal bleeding    Complete by:  As directed      PRETERM LABOR:  Includes any of the follwing  symptoms that occur between 20 - [redacted] weeks gestation.  If these symptoms are not stopped, preterm labor can result in preterm delivery, placing your baby at risk    Complete by:  As directed             Medication List    STOP taking these medications        Menthol (Topical Analgesic) 5 % Pads      TAKE these medications        ALPRAZolam 1 MG tablet  Commonly known as:  XANAX  Take 1 mg by mouth 3 (three) times daily as needed for anxiety.     lamoTRIgine 25 MG tablet  Commonly known as:  LAMICTAL  Take 50 mg by mouth daily.     potassium chloride SA 20 MEQ tablet  Commonly known as:  K-DUR,KLOR-CON  Take 1 tablet (20 mEq total) by mouth daily.     PRENATAL VITAMINS PO  Take 1 tablet by mouth daily.           Follow-up Information    Go to [facility].   Specialty:  Obstetrics and Gynecology   Why:  Friday 05/13/15 at 1030 fro NST and 1145 with Dr Arma Heading information:   [address] [place] [address] [phone]      Follow up with [facility]. Go in 6 days.   Specialty:  Obstetrics and Gynecology   Why:  Tuesday 05/17/15 at 0800 for [place] BPP, AFI and dopplers and Stephanie CNM at [employer] information:   [address] [place] [address] [phone]      Signed: Purcell Nails 05/11/2015, 7:17 PM

## 2015-05-13 ENCOUNTER — Other Ambulatory Visit (HOSPITAL_COMMUNITY): Payer: Self-pay | Admitting: Obstetrics and Gynecology

## 2015-05-13 DIAGNOSIS — O4103X1 Oligohydramnios, third trimester, fetus 1: Secondary | ICD-10-CM

## 2015-05-13 DIAGNOSIS — Z3A35 35 weeks gestation of pregnancy: Secondary | ICD-10-CM

## 2015-05-13 DIAGNOSIS — O2613 Low weight gain in pregnancy, third trimester: Secondary | ICD-10-CM

## 2015-05-13 LAB — CULTURE, OB URINE: SPECIAL REQUESTS: NORMAL

## 2015-05-23 ENCOUNTER — Encounter (HOSPITAL_COMMUNITY): Payer: Self-pay | Admitting: Obstetrics and Gynecology

## 2015-05-27 ENCOUNTER — Ambulatory Visit (HOSPITAL_COMMUNITY)
Admission: RE | Admit: 2015-05-27 | Discharge: 2015-05-27 | Disposition: A | Discharge status: Home / Self Care | Payer: Medicaid Other | Source: Ambulatory Visit | Attending: Obstetrics and Gynecology | Admitting: Obstetrics and Gynecology

## 2015-05-27 ENCOUNTER — Other Ambulatory Visit (HOSPITAL_COMMUNITY): Payer: Self-pay | Admitting: Obstetrics and Gynecology

## 2015-05-27 ENCOUNTER — Other Ambulatory Visit: Payer: Self-pay | Admitting: Obstetrics and Gynecology

## 2015-05-27 ENCOUNTER — Inpatient Hospital Stay (HOSPITAL_COMMUNITY)
Admission: AD | Admit: 2015-05-27 | Discharge: 2015-06-01 | DRG: 775 | Disposition: A | Discharge status: Home / Self Care | Payer: Medicaid Other | Source: Ambulatory Visit | Attending: Obstetrics and Gynecology | Admitting: Obstetrics and Gynecology

## 2015-05-27 VITALS — BP 121/84 | HR 114 | Wt 95.0 lb

## 2015-05-27 DIAGNOSIS — Z3A35 35 weeks gestation of pregnancy: Secondary | ICD-10-CM

## 2015-05-27 DIAGNOSIS — F1290 Cannabis use, unspecified, uncomplicated: Secondary | ICD-10-CM | POA: Diagnosis present

## 2015-05-27 DIAGNOSIS — F319 Bipolar disorder, unspecified: Secondary | ICD-10-CM | POA: Diagnosis present

## 2015-05-27 DIAGNOSIS — O4103X1 Oligohydramnios, third trimester, fetus 1: Secondary | ICD-10-CM

## 2015-05-27 DIAGNOSIS — O9081 Anemia of the puerperium: Secondary | ICD-10-CM | POA: Diagnosis present

## 2015-05-27 DIAGNOSIS — O2613 Low weight gain in pregnancy, third trimester: Secondary | ICD-10-CM

## 2015-05-27 DIAGNOSIS — IMO0002 Reserved for concepts with insufficient information to code with codable children: Secondary | ICD-10-CM

## 2015-05-27 DIAGNOSIS — F419 Anxiety disorder, unspecified: Secondary | ICD-10-CM | POA: Diagnosis present

## 2015-05-27 DIAGNOSIS — D509 Iron deficiency anemia, unspecified: Secondary | ICD-10-CM

## 2015-05-27 DIAGNOSIS — O365931 Maternal care for other known or suspected poor fetal growth, third trimester, fetus 1: Secondary | ICD-10-CM

## 2015-05-27 DIAGNOSIS — O99324 Drug use complicating childbirth: Secondary | ICD-10-CM | POA: Diagnosis present

## 2015-05-27 DIAGNOSIS — O99824 Streptococcus B carrier state complicating childbirth: Secondary | ICD-10-CM | POA: Diagnosis present

## 2015-05-27 DIAGNOSIS — F329 Major depressive disorder, single episode, unspecified: Secondary | ICD-10-CM | POA: Diagnosis present

## 2015-05-27 DIAGNOSIS — F1510 Other stimulant abuse, uncomplicated: Secondary | ICD-10-CM | POA: Diagnosis present

## 2015-05-27 DIAGNOSIS — O99344 Other mental disorders complicating childbirth: Secondary | ICD-10-CM | POA: Diagnosis present

## 2015-05-27 DIAGNOSIS — O9989 Other specified diseases and conditions complicating pregnancy, childbirth and the puerperium: Secondary | ICD-10-CM

## 2015-05-27 DIAGNOSIS — B951 Streptococcus, group B, as the cause of diseases classified elsewhere: Secondary | ICD-10-CM

## 2015-05-27 DIAGNOSIS — O365930 Maternal care for other known or suspected poor fetal growth, third trimester, not applicable or unspecified: Secondary | ICD-10-CM | POA: Diagnosis present

## 2015-05-27 DIAGNOSIS — O4100X0 Oligohydramnios, unspecified trimester, not applicable or unspecified: Secondary | ICD-10-CM | POA: Diagnosis present

## 2015-05-27 DIAGNOSIS — O4103X0 Oligohydramnios, third trimester, not applicable or unspecified: Secondary | ICD-10-CM | POA: Diagnosis present

## 2015-05-27 DIAGNOSIS — Z283 Underimmunization status: Secondary | ICD-10-CM

## 2015-05-27 DIAGNOSIS — D649 Anemia, unspecified: Secondary | ICD-10-CM | POA: Insufficient documentation

## 2015-05-27 DIAGNOSIS — D62 Acute posthemorrhagic anemia: Secondary | ICD-10-CM | POA: Diagnosis not present

## 2015-05-27 DIAGNOSIS — O1404 Mild to moderate pre-eclampsia, complicating childbirth: Secondary | ICD-10-CM | POA: Diagnosis present

## 2015-05-27 DIAGNOSIS — F1590 Other stimulant use, unspecified, uncomplicated: Secondary | ICD-10-CM | POA: Diagnosis present

## 2015-05-27 DIAGNOSIS — O09899 Supervision of other high risk pregnancies, unspecified trimester: Secondary | ICD-10-CM

## 2015-05-27 DIAGNOSIS — Z87891 Personal history of nicotine dependence: Secondary | ICD-10-CM

## 2015-05-27 LAB — CBC
HCT: 26.5 % — ABNORMAL LOW (ref 36.0–46.0)
HEMOGLOBIN: 8.6 g/dL — AB (ref 12.0–15.0)
MCH: 29 pg (ref 26.0–34.0)
MCHC: 32.5 g/dL (ref 30.0–36.0)
MCV: 89.2 fL (ref 78.0–100.0)
PLATELETS: 215 10*3/uL (ref 150–400)
RBC: 2.97 MIL/uL — AB (ref 3.87–5.11)
RDW: 14.3 % (ref 11.5–15.5)
WBC: 8.5 10*3/uL (ref 4.0–10.5)

## 2015-05-27 LAB — TYPE AND SCREEN
ABO/RH(D): A POS
ANTIBODY SCREEN: NEGATIVE

## 2015-05-27 LAB — COMPREHENSIVE METABOLIC PANEL
ALBUMIN: 2.5 g/dL — AB (ref 3.5–5.0)
ALK PHOS: 149 U/L — AB (ref 38–126)
ALT: 16 U/L (ref 14–54)
ANION GAP: 5 (ref 5–15)
AST: 25 U/L (ref 15–41)
BUN: 9 mg/dL (ref 6–20)
CALCIUM: 8.3 mg/dL — AB (ref 8.9–10.3)
CHLORIDE: 105 mmol/L (ref 101–111)
CO2: 23 mmol/L (ref 22–32)
Creatinine, Ser: 0.63 mg/dL (ref 0.44–1.00)
GFR calc non Af Amer: >60 mL/min (ref >60)
GLUCOSE: 76 mg/dL (ref 65–99)
Potassium: 4.1 mmol/L (ref 3.5–5.1)
SODIUM: 133 mmol/L — AB (ref 135–145)
Total Bilirubin: 0.4 mg/dL (ref 0.3–1.2)
Total Protein: 4.9 g/dL — ABNORMAL LOW (ref 6.5–8.1)

## 2015-05-27 LAB — URIC ACID: URIC ACID, SERUM: 3.8 mg/dL (ref 2.3–6.6)

## 2015-05-27 LAB — LACTATE DEHYDROGENASE: LDH: 155 U/L (ref 98–192)

## 2015-05-27 MED ORDER — PENICILLIN G POTASSIUM 5000000 UNITS IJ SOLR
5.0000 10*6.[IU] | Freq: Once | INTRAMUSCULAR | Status: AC
  Administered 2015-05-28: 5 10*6.[IU] via INTRAVENOUS
  Filled 2015-05-27: qty 5

## 2015-05-27 MED ORDER — LACTATED RINGERS IV SOLN
500.0000 mL | INTRAVENOUS | Status: DC | PRN
  Administered 2015-05-28: 250 mL via INTRAVENOUS

## 2015-05-27 MED ORDER — OXYTOCIN 40 UNITS IN LACTATED RINGERS INFUSION - SIMPLE MED
1.0000 m[IU]/min | INTRAVENOUS | Status: DC
  Administered 2015-05-28: 1 m[IU]/min via INTRAVENOUS
  Administered 2015-05-29: 2 m[IU]/min via INTRAVENOUS
  Administered 2015-05-29: 1 m[IU]/min via INTRAVENOUS
  Filled 2015-05-27 (×2): qty 1000

## 2015-05-27 MED ORDER — FENTANYL CITRATE (PF) 100 MCG/2ML IJ SOLN: 100.0000 ug | INTRAMUSCULAR | Status: DC | PRN

## 2015-05-27 MED ORDER — OXYCODONE-ACETAMINOPHEN 5-325 MG PO TABS: 2.0000 | ORAL_TABLET | ORAL | Status: DC | PRN

## 2015-05-27 MED ORDER — MISOPROSTOL 25 MCG QUARTER TABLET
25.0000 ug | ORAL_TABLET | ORAL | Status: DC | PRN
  Administered 2015-05-27: 25 ug via VAGINAL
  Filled 2015-05-27: qty 0.25

## 2015-05-27 MED ORDER — LIDOCAINE HCL (PF) 1 % IJ SOLN
30.0000 mL | INTRAMUSCULAR | Status: DC | PRN
  Filled 2015-05-27: qty 30

## 2015-05-27 MED ORDER — ONDANSETRON HCL 4 MG/2ML IJ SOLN
4.0000 mg | Freq: Four times a day (QID) | INTRAMUSCULAR | Status: DC | PRN
  Administered 2015-05-29: 4 mg via INTRAVENOUS
  Filled 2015-05-27: qty 2

## 2015-05-27 MED ORDER — ZOLPIDEM TARTRATE 5 MG PO TABS
5.0000 mg | ORAL_TABLET | Freq: Every evening | ORAL | Status: DC | PRN
  Administered 2015-05-27 – 2015-05-29 (×2): 5 mg via ORAL
  Filled 2015-05-27 (×2): qty 1

## 2015-05-27 MED ORDER — FLEET ENEMA 7-19 GM/118ML RE ENEM: 1.0000 | ENEMA | RECTAL | Status: DC | PRN

## 2015-05-27 MED ORDER — OXYCODONE-ACETAMINOPHEN 5-325 MG PO TABS: 1.0000 | ORAL_TABLET | ORAL | Status: DC | PRN

## 2015-05-27 MED ORDER — PENICILLIN G POTASSIUM 5000000 UNITS IJ SOLR
2.5000 10*6.[IU] | INTRAMUSCULAR | Status: DC
Start: 2015-05-28 — End: 2015-05-28
  Administered 2015-05-28 (×2): 2.5 10*6.[IU] via INTRAVENOUS
  Filled 2015-05-27 (×5): qty 2.5

## 2015-05-27 MED ORDER — OXYTOCIN BOLUS FROM INFUSION
500.0000 mL | INTRAVENOUS | Status: DC
  Administered 2015-05-30: 500 mL via INTRAVENOUS

## 2015-05-27 MED ORDER — LACTATED RINGERS IV SOLN
INTRAVENOUS | Status: DC
  Administered 2015-05-27 – 2015-05-30 (×5): via INTRAVENOUS

## 2015-05-27 MED ORDER — ACETAMINOPHEN 325 MG PO TABS: 650.0000 mg | ORAL_TABLET | ORAL | Status: DC | PRN

## 2015-05-27 MED ORDER — CITRIC ACID-SODIUM CITRATE 334-500 MG/5ML PO SOLN: 30.0000 mL | ORAL | Status: DC | PRN

## 2015-05-27 MED ORDER — TERBUTALINE SULFATE 1 MG/ML IJ SOLN: 0.2500 mg | Freq: Once | INTRAMUSCULAR | Status: DC | PRN

## 2015-05-27 MED ORDER — OXYTOCIN 40 UNITS IN LACTATED RINGERS INFUSION - SIMPLE MED
62.5000 mL/h | INTRAVENOUS | Status: DC
  Administered 2015-05-30: 62.5 mL/h via INTRAVENOUS

## 2015-05-27 NOTE — Progress Notes (Signed)
Here for MFM [place]:  Hx dx of mild pre-eclampsia dx 05/06/15, with poor fetal growth (IUGR vs SGA vs constitutionally small fetus). [place] today showed IUGR, oligohydramnios, BPP 6/8 (no fluid pocket), but NST reactive = 8/10 Denies leaking or bleeding, reports +FM.  Denies HA, visual sx, or epigastric pain.  Recommendation from MFM to admit for induction due to above issues.  Patient became very upset, crying--today is daughter's birthday, with party scheduled now. Long discussion with patient regarding US findings, dx of pre-eclampsia, and risk to fetus of prolonging pregnancy. Patient seems to understand these issues, but wishes to go to daughter's party. She is willing to return at 7pm for admission for induction.  She committed to me that she would return as instructed. FKCs reviewed, will return earlier with any issues.  Birthing Suite notified of patient's need for admission today. Dr. Su Hilt aware of patient status and plan for return. H&P completed.  Nigel Bridgeman, CNM 05/27/15 10:40a

## 2015-05-27 NOTE — Progress Notes (Addendum)
Labor Progress  Subjective: Pt assumed she would delivery today bc of IOL.  IOL process reviewed.  Plans for an epidural.  Out pt circ.  Objective: Temp(Src) 98.5 F (36.9 C) (Oral)  Resp 16  Ht  (1.448 m)  Wt 95 lb (43.092 kg)  BMI 20.55 kg/m2     FHT: 135, moderate variability, + accel, no decel CTX:  occasionally Uterus gravid, soft non tender SVE:  Dilation: Closed Effacement (%): Thick Station:  (high) Exam by:: V.Standard, CNM Vertex confirmed by bedside US  Assessment:  IUP at 35.2 weeks  Mild pre eclampsia IUGR vs SGA NICHD: Category 1 Membranes:  intact Labor progress: IOL GBS: positive  Plan: Continue labor plan Continuous monitoring IOL with cytotec q4 Pt allowed to eat, decrease to clear when pitocin starts Will reassess with cervical exam at 0200 or earlier if necessary Delay PCN until active      Venus Standard, CNM, MSN 05/27/2015. 9:41 PM

## 2015-05-27 NOTE — H&P (Signed)
Kristi Perkins is a 29 y.o. female, G3P1011 at 14 2/7 weeks, presenting for induction due to IUGR and oligohydramnios noted on [place] today, with known prior dx of mild pre-eclampsia since 05/06/15.  Seen in MFM today for f/u US from hospitalization 05/06/15 through 05/11/15--US AFI 4.57, EFW 1732 gm, 3+9, <10%, dopplers normal, BPP 8/10, (-2 fluid), vtx. Recommendation from MFM for induction due to these issues.  Patient denies any leaking or bleeding, reports +FM.  Denies HA, visual sx, or epigastric pain.  Immediate admission was recommended at the time of patient's MFM appt (approx 11:15a), but patient became very upset after dx and wanted to go to daughter's birthday party today, with promise to return for admission by 7pm.  Declined assessment at that time for leaking fluid.  Is agreeable with plan of care for induction.  Patient Active Problem List   Diagnosis Date Noted  . Oligohydramnios in third trimester 05/27/2015  . Rubella non-immune status, antepartum 05/27/2015  . IUGR (intrauterine growth restriction)--EFW 1732 gm, 3+9, < 10%ile. 05/27/2015  . Group beta Strep positive 05/10/2015  . Nausea and vomiting during pregnancy 05/08/2015  . Bipolar 1 disorder 05/06/2015  . Oligohydramnios--AFI 4.57 05/06/2015  . H/O drug abuse in past 05/06/2015  . Body mass index (BMI) of 20.0-20.9 in adult 05/06/2015  . Hernia 05/06/2015  . Short stature 05/04/2012  . Depression 12/26/2011  . Anxiety 12/26/2011    History of present pregnancy: Patient entered care at 27 1/7 weeks, in transfer from Dr. Shawnie Pons in [place].  EDC of 06/29/15 was established by 14 1/7 week [place] and was congruent with LMP dating.  Anatomy scan: 18 1/7 weeks, with normal findings and a placenta previa noted. Additional [place] evaluations:  04/19/15--EFW 2+11, 6%ile, AFI 16, 55%ile, anterior placenta. 05/08/15--EFW 3+9, 23%ile, lag in HC, FL, and humerus. AFI 11.05, 25%ile. Vtx.  BPP 6/8, with reactive NST = 8/10 Normal  dopplers 9/12-13/16--EFW 3+9, 23%ile, AFI 11.05, BPP 6/8 05/17/15--EFW 1844 gm, 46%ile, AFI 9.9, BPP 8/8, vtx. 05/27/15--EFW 3+13, < 10%ile, AFI 4.57, < 3%ile, BPP 8/10, -2 for fluid, reactive NST = 8/10, vtx, normal dopplers.  Significant prenatal events: Transferred to [facility] from Dr. Shawnie Pons in [place]. Had several visits there, with previa noted on anatomy US. Patient had been on Lamictal, Lithium, and Xanax prior to pregnancy, but was stopped on advice of her PCP with dx of pregnancy. Struggled with anxiety--referred to [facility], but has not had the appt yet. Started on Vistaril at during pregnancy, then went back on Xanax TID and Lamictal during pregnancy after evaluation at the [facility]. [place] on 8/23 showed anterior placenta, growth at 6%ile, and normal fluid. Admitted 05/06/15 for N/V, oligohydramnios, and elevated dopplers.  Received betamethasone course.  Elevated LFTs, hypokalemia, and elevated PCR of 3.0 were noted during during that hospitalization.  UDS was positive for THC and benzodiazepines during that hospitalization.   24 hour urine was 253 on 05/10/15.  Hgb 8.6 on 05/11/15.  MFM consult indicated outpatient management was acceptable, with antenatal testing planned and f/u US scheduled today.  She received K+ supplementation for hypokalemia, with final K+ 3.3 on 9/14.  Urine culture was negative.  Weekly PIH labs were planned.  Recommendation made for induction at 37 weeks, earlier if lab abnormalities, severe range BPs, etc., were to occur. Last evaluation:   At [facility]--05/17/15, BP 108/64, 98 lbs, with EFW 1844 gm, 46%ile, AFI 9.9.   At MFM today--EFW 3+13, < 10%ile, AFI 4.57, < 3%ile, BPP 8/10, -  2 for fluid, reactive NST = 8/10, vtx, normal dopplers.  Past obstetric history: OB History  Gravida Para Term Preterm AB SAB TAB Ectopic Multiple Living  0 1 0 1 0 0 1     # Outcome Date GA Lbr Len/2nd Weight Sex Delivery  Anes PTL Lv  3 Current           2 Term 05/26/12 [redacted]w[redacted]d 25:45 / 10:13 3.204 kg (7 lb 1 oz) F Vag-Spont EPI,Local  Y   Comments: Caput  1 TAB 2011 [redacted]w[redacted]d   U               2013--delivered by Sanda Klein, CNM    Past Medical History  Diagnosis Date  . Hernia   . Anxiety     on xanax at beginning of preg   . FH: anemia     Father  . FH: Parkinson's disease     Father  . UTI (urinary tract infection)      x1  . H/O varicella   . FH: heart disease   . FH: diabetes mellitus   . FH: migraines   . FH: kidney disease   . FH: cancer   . FH: liver disease   . Low iron   . Hx of ovarian cyst   . H/O cocaine abuse 2006     had rehab   . Depression     bipolar   No past surgical history on file.   Family History: family history includes Alcohol abuse in her father; Cancer in her maternal grandmother; Depression in her father; Diabetes in her father; Heart disease in her father; Hypertension in her father; Kidney disease in her father and paternal grandfather.   Social History:  reports that she has quit smoking. She has never used smokeless tobacco. She reports that she does not drink alcohol or use illicit drugs.  Hx of drug use in past.  Patient is Caucasian, married to [place].  She has a high-school education, and is a Games developer.   Prenatal Transfer Tool  Maternal Diabetes: No Genetic Screening: Declined Maternal Ultrasounds/Referrals: Abnormal:  Findings:   IUGR, Other:Oligohydramnios Fetal Ultrasounds or other Referrals:  Referred to Materal Fetal Medicine  for IUGR and oligo, mild pre-eclampsia Maternal Substance Abuse:  Yes:  Type: Marijuana, Other: Hx drug use in past Significant Maternal Medications:  Meds include: Other: Xanax, Lamictal Significant Maternal Lab Results: Lab values include: Group B Strep positive  TDAP NA Flu NA  ROS:  +FM  No Known Allergies     Blood pressure  121/84, pulse 114, weight 43.092 kg (95 lb), unknown if currently breastfeeding.  Chest clear Heart RRR without murmur Abd gravid, NT, FH 31 cm Pelvic: Will be examined after arrival Ext: WNL  FHR: Category 1 on NST in MFM today UCs:  None  Prenatal labs: ABO, Rh: --/--/A POS (09/09 1912)A+ Antibody: NEG (09/09 1912)Neg Rubella:  Non-immune RPR:   NR HBsAg:   Neg HIV:   NR GBS: Positive 05/06/15 Sickle cell/Hgb electrophoresis: NA Pap: Unknown GC: Unknown--not documented in previous records Chlamydia: Unknown--not documented in previous records. Genetic screenings: Declined Glucola: WNL Other:  Hgb 11.6 at NOB, 10.7 at 28 weeks, 8.6 on 9/14 TSH 0.275 12/30/14    Assessment/Plan: IUP at 35 2/7 weeks IUGR Oligohydramnios GBS positive Bipolar dx Hx drug abuse in past Short stature Depression/anxiety  Plan: Admit to [facility]thing Suite per consult with Dr. Su Hilt Routine [facility] orders Pain med/epidural prn PCN  G for GBS prophylaxis as labor advances. SW consult for bipolar/depression/anxiety Continuous EFM Induction based on status of cervix/contractions/FHR at time of admission. UDS and PIH labs, PCR on admission. Urine for GC/chlamydia on admission.  Nyra Capes, [place] 05/27/2015, 12:24 PM

## 2015-05-28 ENCOUNTER — Inpatient Hospital Stay (HOSPITAL_COMMUNITY): Payer: Medicaid Other | Admitting: Anesthesiology

## 2015-05-28 LAB — COMPREHENSIVE METABOLIC PANEL
ALBUMIN: 2.2 g/dL — AB (ref 3.5–5.0)
ALK PHOS: 183 U/L — AB (ref 38–126)
ALT: 15 U/L (ref 14–54)
ALT: 17 U/L (ref 14–54)
ANION GAP: 6 (ref 5–15)
AST: 22 U/L (ref 15–41)
AST: 24 U/L (ref 15–41)
Albumin: 2.5 g/dL — ABNORMAL LOW (ref 3.5–5.0)
Alkaline Phosphatase: 151 U/L — ABNORMAL HIGH (ref 38–126)
Anion gap: 4 — ABNORMAL LOW (ref 5–15)
BILIRUBIN TOTAL: 0.6 mg/dL (ref 0.3–1.2)
BUN: 6 mg/dL (ref 6–20)
BUN: 5 mg/dL — ABNORMAL LOW (ref 6–20)
CALCIUM: 8.4 mg/dL — AB (ref 8.9–10.3)
CHLORIDE: 104 mmol/L (ref 101–111)
CO2: 22 mmol/L (ref 22–32)
CO2: 23 mmol/L (ref 22–32)
CREATININE: 0.59 mg/dL (ref 0.44–1.00)
CREATININE: 0.62 mg/dL (ref 0.44–1.00)
Calcium: 7.8 mg/dL — ABNORMAL LOW (ref 8.9–10.3)
Chloride: 105 mmol/L (ref 101–111)
GFR calc Af Amer: >60 mL/min (ref >60)
GFR calc non Af Amer: >60 mL/min (ref >60)
GLUCOSE: 85 mg/dL (ref 65–99)
Glucose, Bld: 85 mg/dL (ref 65–99)
POTASSIUM: 3.8 mmol/L (ref 3.5–5.1)
Potassium: 4.1 mmol/L (ref 3.5–5.1)
SODIUM: 130 mmol/L — AB (ref 135–145)
SODIUM: 134 mmol/L — AB (ref 135–145)
TOTAL PROTEIN: 5.5 g/dL — AB (ref 6.5–8.1)
Total Bilirubin: 0.2 mg/dL — ABNORMAL LOW (ref 0.3–1.2)
Total Protein: 4.8 g/dL — ABNORMAL LOW (ref 6.5–8.1)

## 2015-05-28 LAB — CBC
HCT: 27.1 % — ABNORMAL LOW (ref 36.0–46.0)
HEMATOCRIT: 30.8 % — AB (ref 36.0–46.0)
HEMOGLOBIN: 8.8 g/dL — AB (ref 12.0–15.0)
HEMOGLOBIN: 9.8 g/dL — AB (ref 12.0–15.0)
MCH: 29.3 pg (ref 26.0–34.0)
MCH: 28.7 pg (ref 26.0–34.0)
MCHC: 32.5 g/dL (ref 30.0–36.0)
MCHC: 31.8 g/dL (ref 30.0–36.0)
MCV: 90.3 fL (ref 78.0–100.0)
MCV: 90.3 fL (ref 78.0–100.0)
PLATELETS: 210 10*3/uL (ref 150–400)
Platelets: 243 10*3/uL (ref 150–400)
RBC: 3 MIL/uL — ABNORMAL LOW (ref 3.87–5.11)
RBC: 3.41 MIL/uL — AB (ref 3.87–5.11)
RDW: 14.2 % (ref 11.5–15.5)
RDW: 14.2 % (ref 11.5–15.5)
WBC: 9.1 10*3/uL (ref 4.0–10.5)
WBC: 11.3 10*3/uL — ABNORMAL HIGH (ref 4.0–10.5)

## 2015-05-28 LAB — RAPID URINE DRUG SCREEN, HOSP PERFORMED
AMPHETAMINES: POSITIVE — AB
BARBITURATES: NOT DETECTED
Benzodiazepines: POSITIVE — AB
Cocaine: NOT DETECTED
Opiates: NOT DETECTED
TETRAHYDROCANNABINOL: POSITIVE — AB

## 2015-05-28 LAB — RPR: RPR Ser Ql: NONREACTIVE

## 2015-05-28 LAB — URIC ACID
URIC ACID, SERUM: 2.8 mg/dL (ref 2.3–6.6)
Uric Acid, Serum: 3 mg/dL (ref 2.3–6.6)

## 2015-05-28 LAB — LACTATE DEHYDROGENASE
LDH: 146 U/L (ref 98–192)
LDH: 171 U/L (ref 98–192)

## 2015-05-28 MED ORDER — DIPHENHYDRAMINE HCL 50 MG/ML IJ SOLN: 12.5000 mg | INTRAMUSCULAR | Status: DC | PRN

## 2015-05-28 MED ORDER — PENICILLIN G POTASSIUM 5000000 UNITS IJ SOLR
5.0000 10*6.[IU] | Freq: Once | INTRAVENOUS | Status: AC
  Administered 2015-05-29: 5 10*6.[IU] via INTRAVENOUS
  Filled 2015-05-28: qty 5

## 2015-05-28 MED ORDER — LIDOCAINE-EPINEPHRINE (PF) 2 %-1:200000 IJ SOLN
INTRAMUSCULAR | Status: DC | PRN
  Administered 2015-05-28: 3 mL

## 2015-05-28 MED ORDER — ALPRAZOLAM 0.5 MG PO TABS
1.0000 mg | ORAL_TABLET | Freq: Three times a day (TID) | ORAL | Status: DC
  Administered 2015-05-28 – 2015-05-29 (×6): 1 mg via ORAL
  Filled 2015-05-28 (×7): qty 2

## 2015-05-28 MED ORDER — FENTANYL 2.5 MCG/ML BUPIVACAINE 1/10 % EPIDURAL INFUSION (WH - ANES)
14.0000 mL/h | INTRAMUSCULAR | Status: DC | PRN
  Administered 2015-05-28: 10 mL/h via EPIDURAL
  Administered 2015-05-28 – 2015-05-30 (×6): 14 mL/h via EPIDURAL
  Filled 2015-05-28 (×7): qty 125

## 2015-05-28 MED ORDER — HYDRALAZINE HCL 20 MG/ML IJ SOLN: 10.0000 mg | Freq: Once | INTRAMUSCULAR | Status: DC | PRN

## 2015-05-28 MED ORDER — DEXTROSE 5 % IV SOLN
2.5000 10*6.[IU] | INTRAVENOUS | Status: DC
  Administered 2015-05-29 – 2015-05-30 (×4): 2.5 10*6.[IU] via INTRAVENOUS
  Filled 2015-05-28 (×7): qty 2.5

## 2015-05-28 MED ORDER — BUPIVACAINE HCL (PF) 0.25 % IJ SOLN
INTRAMUSCULAR | Status: DC | PRN
  Administered 2015-05-28 – 2015-05-30 (×3): 4 mL via EPIDURAL

## 2015-05-28 MED ORDER — LAMOTRIGINE 25 MG PO TABS
50.0000 mg | ORAL_TABLET | Freq: Every day | ORAL | Status: DC
  Administered 2015-05-28 – 2015-05-29 (×2): 50 mg via ORAL
  Filled 2015-05-28 (×3): qty 2

## 2015-05-28 MED ORDER — PHENYLEPHRINE 40 MCG/ML (10ML) SYRINGE FOR IV PUSH (FOR BLOOD PRESSURE SUPPORT)
80.0000 ug | PREFILLED_SYRINGE | INTRAVENOUS | Status: DC | PRN
  Filled 2015-05-28 (×2): qty 20

## 2015-05-28 MED ORDER — LABETALOL HCL 5 MG/ML IV SOLN
20.0000 mg | INTRAVENOUS | Status: DC | PRN
  Administered 2015-05-30: 20 mg via INTRAVENOUS
  Filled 2015-05-28: qty 4

## 2015-05-28 MED ORDER — EPHEDRINE 5 MG/ML INJ
10.0000 mg | INTRAVENOUS | Status: DC | PRN
Start: 2015-05-28 — End: 2015-05-30

## 2015-05-28 NOTE — Progress Notes (Signed)
Report given and care relinquished to J. Cox, Charity fundraiser.

## 2015-05-28 NOTE — Progress Notes (Addendum)
  Subjective: Assuming care of Kristi Perkins, 29 yo G3P1011 @ 35.3 wks admitted yesterday 2/2 induction for IUGR and oligohydramnios noted on [place], with known prior dx of preE without severe features since 05/06/15.   Just received evening meal. Denies h/a, visual changes, epigastric pain or difficulty breathing.  FOB and daughter at bedside.  Objective: BP 129/91 mmHg  Pulse 93  Temp(Src) 98.7 F (37.1 C) (Oral)  Resp 18  Ht  (1.448 m)  Wt 43.092 kg (95 lb)  BMI 20.55 kg/m2  SpO2 100% I/O last 3 completed shifts: In: 1812.5 [I.V.:1812.5] Out: -    Today's Vitals   05/28/15 1730 05/28/15 1810 05/28/15 1836 05/28/15 1900  BP: 134/90 156/98 135/98 129/91  Pulse: 78 80 90 93  Temp: 98.7 F (37.1 C)     TempSrc: Oral     Resp: 18     Height:      Weight:      SpO2:      PainSc:       Gen: Flat affect w/ very little eye contact FHT: BL 140 w/ moderate variability, +accels, rare variable, no lates UC:   irregular, every 3-6 minutes, mostly irritability SVE: Deferred   Pitocin off at 1854  Urine tox screen + for THC  Assessment:  IUP at 35.3 wks due to IUGR and oligo preE w/o severe features Rubella NI GBS positive Bipolar illness - on Lamictal XR Underweight Depression Anxiety - on Xanax tid + THC on urine tox screen Overall reassuring FHRT  Plan: Discussed intracervical foley bulb as next step in induction process, and due to difficulty w/ cervical exams, discussed having epidural placed prior to procedure. Will allow pt time to consider proposal. PE after meal. CBC prior to epidural placement. Will obtain another set of preE labs as well. Restart antibiotics for GBS prophylaxis w/ ROM or active labor. IV antihypertensives as indicated. Consult prn.      Sherre Scarlet CNM 05/28/2015, 8:07 PM

## 2015-05-28 NOTE — Anesthesia Preprocedure Evaluation (Signed)
Anesthesia Evaluation  Patient identified by MRN, date of birth, ID band Patient awake    Reviewed: Allergy & Precautions, Patient's Chart, lab work & pertinent test results  History of Anesthesia Complications Negative for: history of anesthetic complications  Airway Mallampati: I  TM Distance: >3 FB Neck ROM: Full    Dental  (+) Teeth Intact   Pulmonary neg pulmonary ROS, former smoker,    breath sounds clear to auscultation       Cardiovascular negative cardio ROS   Rhythm:Regular     Neuro/Psych PSYCHIATRIC DISORDERS Anxiety Depression Bipolar Disorder negative neurological ROS     GI/Hepatic negative GI ROS, Neg liver ROS,   Endo/Other  negative endocrine ROS  Renal/GU negative Renal ROS     Musculoskeletal   Abdominal   Peds  Hematology  (+) anemia ,   Anesthesia Other Findings   Reproductive/Obstetrics (+) Pregnancy                             Anesthesia Physical Anesthesia Plan  ASA: II  Anesthesia Plan: Epidural   Post-op Pain Management:    Induction:   Airway Management Planned:   Additional Equipment:   Intra-op Plan:   Post-operative Plan:   Informed Consent: I have reviewed the patients History and Physical, chart, labs and discussed the procedure including the risks, benefits and alternatives for the proposed anesthesia with the patient or authorized representative who has indicated his/her understanding and acceptance.   Dental advisory given  Plan Discussed with: Anesthesiologist  Anesthesia Plan Comments:         Anesthesia Quick Evaluation
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
## 2015-05-28 NOTE — Progress Notes (Signed)
Returned to bed safely, EFM adjusted.

## 2015-05-28 NOTE — Progress Notes (Addendum)
Labor Progress  Subjective: Pt does not tolerate VE well  Objective: BP 136/84 mmHg  Pulse 65  Temp(Src) 98.4 F (36.9 C) (Oral)  Resp 18  Ht  (1.448 m)  Wt 95 lb (43.092 kg)  BMI 20.55 kg/m2  SpO2 100% I/O last 3 completed shifts: In: 1812.5 [I.V.:1812.5] Out: -    FHT: 140, moderate variability, +accell, no decels CTX:  regular, every 2-3 minutes Uterus gravid, soft non tender SVE:  Dilation: Closed Effacement (%): Thick Station:  (high) Exam by:: V.Patrese Neal, CNM   Assessment:  IUP at 35.3 weeks NICHD: Category 1 Membranes: intact Labor progress: IOL GBS: positive   Plan: Continue labor plan Continuous monitoring Rest Report to Nigel Bridgeman Dr Richardson Dopp to decide on POC     Hydia Copelin, CNM, MSN 05/28/2015. 8:17 AM

## 2015-05-28 NOTE — Progress Notes (Signed)
Labor Progress  Subjective: Sleeping denies ctx  Objective: BP 124/84 mmHg  Pulse 76  Temp(Src) 98.3 F (36.8 C) (Oral)  Resp 16  Ht  (1.448 m)  Wt 95 lb (43.092 kg)  BMI 20.55 kg/m2  SpO2 100%     FHT: 135, moderate variability,+ accel, no decel CTX:  regular, every 3-4 minutes Uterus gravid, soft non tender SVE:  Dilation: Fingertip Effacement (%): Thick Station: -3 Exam by:: V.Grice, RNC   Assessment:  IUP at 35.3 weeks NICHD: Category 1 Membranes:  intact Labor progress: IOL GBS: positive Dr Richardson Dopp at the bedside  Plan: Continue labor plan Continuous monitoring Rest Will reassess with cervical exam at 0700 or earlier if necessary       Kristi Perkins, CNM, MSN 05/28/2015. 4:41 AM

## 2015-05-28 NOTE — Progress Notes (Signed)
CNM to bedside, FHR surveillance reviewed with RN.

## 2015-05-28 NOTE — Progress Notes (Signed)
V.Standard, CNM at bedside, plan of care discussed, questions answered, SVE performed. Vertex presentation confirmed by U/S by CNM.
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
## 2015-05-28 NOTE — Progress Notes (Signed)
Pt ambulated safely to BR. EFM disconnected. Cat II FHR Surveillance noted.

## 2015-05-28 NOTE — Progress Notes (Signed)
Nutrition Trigger: Nutrition admission assessment, weight loss Pre-preg weight of 96 lbs, current weight 95 lbs   Hx of n/v during pregnancy. Pt has lost 6 libs since the 9/14 recorded weight of 101 lbs. Pt currently for IOL Appetite post delivery should be monitored Add snacks TID or Ensure Enlive BID for poor appetite after delivery  [employer] M.Odis Luster LDN Neonatal Nutrition Support Specialist/RD III Pager [phone]      Phone [phone]

## 2015-05-28 NOTE — Progress Notes (Signed)
  Subjective: Had breakfast and showered.  Ready for initiation of pitocin.  Husband, Timothy Lasso, at bedside.  Objective: BP 131/90 mmHg  Pulse 81  Temp(Src) 98.4 F (36.9 C) (Oral)  Resp 18  Ht  (1.448 m)  Wt 43.092 kg (95 lb)  BMI 20.55 kg/m2  SpO2 100% I/O last 3 completed shifts: In: 1812.5 [I.V.:1812.5] Out: -    Filed Vitals:   05/28/15 0431 05/28/15 0605 05/28/15 0804 05/28/15 0901  BP: 137/87 141/88 136/84 131/90  Pulse: 63 77 65 81  Temp:   98.4 F (36.9 C)   TempSrc:   Oral   Resp:   18   Height:      Weight:      SpO2:          FHT: Category 1 UC:   Regular, q 3 min, mild--patient unaware of most. SVE:   Dilation: 1 Effacement (%): 70 Station: -1, -2 Exam by:: Nigel Bridgeman, CNM at [phone].  Patient very intolerant of pelvic exams. Vtx presentation verified by BS [place], to avoid need for repeat VE.  GBS prophylaxis begun 0811  Assessment:  IUP at 35 3/7 weeks Mild pre-eclampsia IUGR Oligohydramnios GBS positive Rubella non-immune Bipolar dx Low BMI Short stature Depression/anxiety Anemia   Plan: Start pitocin per low dose protocol. Continue GBS prophylaxis Continue Lamictal and Xanax per home regimen. Plan SW consult pp. Check PIH labs this am. Patient plans epidural.  Kristi Perkins CNM 05/28/2015, 11:16 AM

## 2015-05-28 NOTE — Anesthesia Procedure Notes (Signed)
Epidural Patient location during procedure: OB  Staffing Anesthesiologist: Roselind Klus Performed by: anesthesiologist   Preanesthetic Checklist Completed: patient identified, surgical consent, pre-op evaluation, timeout performed, IV checked, risks and benefits discussed and monitors and equipment checked  Epidural Patient position: sitting Prep: DuraPrep Patient monitoring: heart rate, cardiac monitor, continuous pulse ox and blood pressure Approach: midline Location: L3-L4 Injection technique: LOR saline  Needle:  Needle type: Tuohy  Needle gauge: 17 G Needle length: 9 cm Needle insertion depth: 4 cm Catheter type: closed end flexible Catheter size: 19 Gauge Catheter at skin depth: 10 cm Test dose: negative and 2% lidocaine with Epi 1:200 K  Assessment Events: blood not aspirated, injection not painful, no injection resistance, negative IV test and no paresthesia  Additional Notes Reason for block:procedure for pain

## 2015-05-28 NOTE — Progress Notes (Signed)
Pt repositions self for comfort. Continuous FHR surveillance interrupted with maternal position change. RN to bedside, EFM adjusted.   

## 2015-05-28 NOTE — Progress Notes (Signed)
Labor Progress  Subjective: sleeping  Objective: BP 124/84 mmHg  Pulse 72  Temp(Src) 98.3 F (36.8 C) (Oral)  Resp 16  Ht  (1.448 m)  Wt 95 lb (43.092 kg)  BMI 20.55 kg/m2  SpO2 100%     FHT: variable fhr CTX:  regular, every 3 minutes Uterus gravid, soft non tender SVE:  Dilation: Fingertip Effacement (%): Thick Station: -3 Exam by:: V.Grice, RNC  Assessment:  IUP at 35.3 weeks NICHD: Category 2 Membranes:  intact Labor progress: IOL GBS: positive IVF bolus  IVF Bolus  Plan: Continue labor plan Continuous monitoring Rest Will reassess with cervical exam at 0400 or earlier if necessary 500cc Bolus   Winda Summerall, CNM, MSN 05/28/2015. 3:11 AM

## 2015-05-29 LAB — COMPREHENSIVE METABOLIC PANEL
ALK PHOS: 175 U/L — AB (ref 38–126)
ALT: 15 U/L (ref 14–54)
AST: 21 U/L (ref 15–41)
Albumin: 2.4 g/dL — ABNORMAL LOW (ref 3.5–5.0)
Anion gap: 5 (ref 5–15)
CALCIUM: 8.1 mg/dL — AB (ref 8.9–10.3)
CO2: 23 mmol/L (ref 22–32)
CREATININE: 0.51 mg/dL (ref 0.44–1.00)
Chloride: 100 mmol/L — ABNORMAL LOW (ref 101–111)
Glucose, Bld: 85 mg/dL (ref 65–99)
Potassium: 3.7 mmol/L (ref 3.5–5.1)
Sodium: 128 mmol/L — ABNORMAL LOW (ref 135–145)
Total Bilirubin: 0.5 mg/dL (ref 0.3–1.2)
Total Protein: 5.6 g/dL — ABNORMAL LOW (ref 6.5–8.1)

## 2015-05-29 LAB — CBC
HCT: 31.2 % — ABNORMAL LOW (ref 36.0–46.0)
HEMOGLOBIN: 10 g/dL — AB (ref 12.0–15.0)
MCH: 29.1 pg (ref 26.0–34.0)
MCHC: 32.1 g/dL (ref 30.0–36.0)
MCV: 90.7 fL (ref 78.0–100.0)
Platelets: 219 10*3/uL (ref 150–400)
RBC: 3.44 MIL/uL — AB (ref 3.87–5.11)
RDW: 14.4 % (ref 11.5–15.5)
WBC: 13.1 10*3/uL — AB (ref 4.0–10.5)

## 2015-05-29 LAB — LACTATE DEHYDROGENASE: LDH: 141 U/L (ref 98–192)

## 2015-05-29 LAB — PROTEIN / CREATININE RATIO, URINE
CREATININE, URINE: 11 mg/dL
Protein Creatinine Ratio: 0.73 mg/mg{Cre} — ABNORMAL HIGH (ref 0.00–0.15)
Total Protein, Urine: 8 mg/dL

## 2015-05-29 MED ORDER — SODIUM BICARBONATE 8.4 % IV SOLN
INTRAVENOUS | Status: DC | PRN
  Administered 2015-05-29: 7 mL via EPIDURAL

## 2015-05-29 NOTE — Progress Notes (Signed)
  Subjective: Both pt and FOB sleeping.  Objective: BP 144/96 mmHg  Pulse 92  Temp(Src) 98.7 F (37.1 C) (Oral)  Resp 16  Ht  (1.448 m)  Wt 43.092 kg (95 lb)  BMI 20.55 kg/m2  SpO2 99% I/O last 3 completed shifts: In: 1812.5 [I.V.:1812.5] Out: -    Today's Vitals   05/29/15 0330 05/29/15 0400 05/29/15 0430 05/29/15 0500  BP: 133/87 136/87 135/86 144/96  Pulse: 90 91 85 92  Temp:      TempSrc:      Resp:      Height:      Weight:      SpO2:      PainSc:       BP range 123-144/75-96 -- has not required IV antihypertensives  FHT: BL 140 w/ moderate variability, +accels, occ variable, no lates UC:   irregular, every 3-4 minutes, some irritability SVE: Deferred   Pitocin at 5 mU/min Foley bulb taut      Assessment:  IOL FWB: Overall reassuring GBS positive  Plan: Continue induction Consult prn  Sherre Scarlet CNM 05/29/2015, 6:03 AM

## 2015-05-29 NOTE — Progress Notes (Signed)
Thornell Mule, CNM called. CNM notified of current BP's and absence of associated symptoms. Plan of care discussed and antihypertensive therapy and parameters reviewed.
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
## 2015-05-29 NOTE — Progress Notes (Signed)
Arva Chafe, CRNA at bedside, dermatome level assessed, and plan of care for pain management discussed.

## 2015-05-29 NOTE — Progress Notes (Addendum)
Labor Progress  Subjective: No complaints, very anxious regarding VE and lack of change  Objective: BP 135/99 mmHg  Pulse 91  Temp(Src) 98.2 F (36.8 C) (Oral)  Resp 16  Ht  (1.448 m)  Wt 95 lb (43.092 kg)  BMI 20.55 kg/m2  SpO2 100% I/O last 3 completed shifts: In: 1812.5 [I.V.:1812.5] Out: 1400 [Urine:1400] Total I/O In: -  Out: 1200 [Urine:1200]   Results for orders placed or performed during the hospital encounter of 05/27/15 (from the past 24 hour(s))  CBC     Status: Abnormal   Collection Time: 05/28/15  9:15 PM  Result Value Ref Range   WBC 11.3 (H) 4.0 - 10.5 K/uL   RBC 3.41 (L) 3.87 - 5.11 MIL/uL   Hemoglobin 9.8 (L) 12.0 - 15.0 g/dL   HCT 40.9 (L) 81.1 - 91.4 %   MCV 90.3 78.0 - 100.0 fL   MCH 28.7 26.0 - 34.0 pg   MCHC 31.8 30.0 - 36.0 g/dL   RDW 78.2 95.6 - 21.3 %   Platelets 243 150 - 400 K/uL  Comprehensive metabolic panel     Status: Abnormal   Collection Time: 05/28/15  9:15 PM  Result Value Ref Range   Sodium 134 (L) 135 - 145 mmol/L   Potassium 4.1 3.5 - 5.1 mmol/L   Chloride 105 101 - 111 mmol/L   CO2 23 22 - 32 mmol/L   Glucose, Bld 85 65 - 99 mg/dL   BUN 5 (L) 6 - 20 mg/dL   Creatinine, Ser 0.86 0.44 - 1.00 mg/dL   Calcium 8.4 (L) 8.9 - 10.3 mg/dL   Total Protein 5.5 (L) 6.5 - 8.1 g/dL   Albumin 2.5 (L) 3.5 - 5.0 g/dL   AST 24 15 - 41 U/L   ALT 17 14 - 54 U/L   Alkaline Phosphatase 183 (H) 38 - 126 U/L   Total Bilirubin 0.6 0.3 - 1.2 mg/dL   GFR calc non Af Amer >60 >60 mL/min   GFR calc Af Amer >60 >60 mL/min   Anion gap 6 5 - 15  Lactate dehydrogenase     Status: None   Collection Time: 05/28/15  9:15 PM  Result Value Ref Range   LDH 171 98 - 192 U/L  Uric acid     Status: None   Collection Time: 05/28/15  9:15 PM  Result Value Ref Range   Uric Acid, Serum 2.8 2.3 - 6.6 mg/dL  CBC     Status: Abnormal   Collection Time: 05/29/15  5:20 PM  Result Value Ref Range   WBC 13.1 (H) 4.0 - 10.5 K/uL   RBC 3.44 (L) 3.87 - 5.11  MIL/uL   Hemoglobin 10.0 (L) 12.0 - 15.0 g/dL   HCT 57.8 (L) 46.9 - 62.9 %   MCV 90.7 78.0 - 100.0 fL   MCH 29.1 26.0 - 34.0 pg   MCHC 32.1 30.0 - 36.0 g/dL   RDW 52.8 41.3 - 24.4 %   Platelets 219 150 - 400 K/uL  Comprehensive metabolic panel     Status: Abnormal   Collection Time: 05/29/15  5:20 PM  Result Value Ref Range   Sodium 128 (L) 135 - 145 mmol/L   Potassium 3.7 3.5 - 5.1 mmol/L   Chloride 100 (L) 101 - 111 mmol/L   CO2 23 22 - 32 mmol/L   Glucose, Bld 85 65 - 99 mg/dL   BUN <5 (L) 6 - 20 mg/dL   Creatinine, Ser 0.10 0.44 -  1.00 mg/dL   Calcium 8.1 (L) 8.9 - 10.3 mg/dL   Total Protein 5.6 (L) 6.5 - 8.1 g/dL   Albumin 2.4 (L) 3.5 - 5.0 g/dL   AST 21 15 - 41 U/L   ALT 15 14 - 54 U/L   Alkaline Phosphatase 175 (H) 38 - 126 U/L   Total Bilirubin 0.5 0.3 - 1.2 mg/dL   GFR calc non Af Amer >60 >60 mL/min   GFR calc Af Amer >60 >60 mL/min   Anion gap 5 5 - 15  Lactate dehydrogenase     Status: None   Collection Time: 05/29/15  5:20 PM  Result Value Ref Range   LDH 141 98 - 192 U/L   FHT: 135, moderate variability, + accel, no decels CTX:  regular, every 3 minutes Uterus gravid, soft non tender SVE:  1-2/70/-2 Pitocin at 41mUn/min  Assessment:  IUP at 35.4 weeks NICHD: Category 1 Membranes:  intact Labor progress: IOL Pitocin Augmentation GBS: positive Blood show Bulb still in place Blood in uring  Plan: Continue labor plan Continuous monitoring Continue pitocin per protocol    Jaleel Allen, CNM, MSN 05/29/2015. 6:16 PM Labor Progress
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
## 2015-05-29 NOTE — Progress Notes (Signed)
  Subjective: Assuming care. Denies h/a, visual changes, epigastric pain or difficulty breathing.   Objective: BP 146/102 mmHg  Pulse 83  Temp(Src) 97.4 F (36.3 C) (Oral)  Resp 14  Ht  (1.448 m)  Wt 43.092 kg (95 lb)  BMI 20.55 kg/m2  SpO2 100% I/O last 3 completed shifts: In: -  Out: 3400 [Urine:3400]   Today's Vitals   05/29/15 2130 05/29/15 2200 05/29/15 2225 05/29/15 2230  BP: 137/109 142/98  146/102  Pulse: 87 99  83  Temp:      TempSrc:      Resp:      Height:      Weight:      SpO2:      PainSc:   0-No pain    BP range 129-159/89-108  FHT: BL 130 w/ moderate variability, +accels, no decels UC:   regular, every 3 minutes SVE:   Dilation: 1.5 Effacement (%): 70 Station: -2 Exam by:: Standard, CNM  at 1823 Pitocin at 10 mU/min FB taut SCDs in place  Assessment:  IUP at 35.4 wks IOL due to preE w/o severe features, oligo and IUGR GBS positive Cat 1 FHRT  Plan: Discussed plan at sign out with Dr. Richardson Dopp. If FB not out by midnight, will remove and attempt AROM. If successful, will place internals and begin amnioinfusion. IV antihypertensives as indicated. Consult prn.  Sherre Scarlet CNM 05/29/2015, 11:00 PM

## 2015-05-29 NOTE — Progress Notes (Addendum)
Subjective: Reports 2 incorrect meals by dietary --- wanted to eat before procedures. 3rd meal correct and tolerated well. Epidural placed at 2339; spouse now back in room. After appropriate monitoring post Epidural, and pt comfortable, she was positioned for intracervical foley bulb placement.   Denies h/a, visual changes, epigastric pain or difficulty breathing.   Objective: BP 142/95 mmHg  Pulse 88  Temp(Src) 98.7 F (37.1 C) (Oral)  Resp 16  Ht  (1.448 m)  Wt 43.092 kg (95 lb)  BMI 20.55 kg/m2  SpO2 97% I/O last 3 completed shifts: In: 1812.5 [I.V.:1812.5] Out: -    Today's Vitals   05/29/15 0000 05/29/15 0003 05/29/15 0005 05/29/15 0008  BP: 140/93  142/95   Pulse: 104 105 100 88  Temp:      TempSrc:      Resp:      Height:      Weight:      SpO2:  100%  97%  PainSc:      Has not required IV antihypertensives  Gen: Calm w/ eyes closed after intracervical foley bulb placement Lungs: CTAB CV: RRR w/o M/R/G Abdomen: gravid, soft, NT Brachial DTRs 2+ bilaterally, no edema, no clonus FHT: BL 140 w/ moderate variability, +accels, no decels UC:  Occasional SVE:   Dilation: 1 Effacement (%): 70 Station: -2 Exam by:: Sherre Scarlet, CNM  Cephalic by Leopolds EFW 3+13 lbs per [place] Intracervical foley bulb placed w/ speculum w/o event at 0034; tension applied -- increased anxiety/tearful during procedure but tolerated well  Results for orders placed or performed during the hospital encounter of 05/27/15 (from the past 24 hour(s))  CBC     Status: Abnormal   Collection Time: 05/28/15 12:10 PM  Result Value Ref Range   WBC 9.1 4.0 - 10.5 K/uL   RBC 3.00 (L) 3.87 - 5.11 MIL/uL   Hemoglobin 8.8 (L) 12.0 - 15.0 g/dL   HCT 16.1 (L) 09.6 - 04.5 %   MCV 90.3 78.0 - 100.0 fL   MCH 29.3 26.0 - 34.0 pg   MCHC 32.5 30.0 - 36.0 g/dL   RDW 40.9 81.1 - 91.4 %   Platelets 210 150 - 400 K/uL  Comprehensive metabolic panel     Status: Abnormal   Collection Time: 05/28/15  12:10 PM  Result Value Ref Range   Sodium 130 (L) 135 - 145 mmol/L   Potassium 3.8 3.5 - 5.1 mmol/L   Chloride 104 101 - 111 mmol/L   CO2 22 22 - 32 mmol/L   Glucose, Bld 85 65 - 99 mg/dL   BUN 6 6 - 20 mg/dL   Creatinine, Ser 7.82 0.44 - 1.00 mg/dL   Calcium 7.8 (L) 8.9 - 10.3 mg/dL   Total Protein 4.8 (L) 6.5 - 8.1 g/dL   Albumin 2.2 (L) 3.5 - 5.0 g/dL   AST 22 15 - 41 U/L   ALT 15 14 - 54 U/L   Alkaline Phosphatase 151 (H) 38 - 126 U/L   Total Bilirubin 0.2 (L) 0.3 - 1.2 mg/dL   GFR calc non Af Amer >60 >60 mL/min   GFR calc Af Amer >60 >60 mL/min   Anion gap 4 (L) 5 - 15  Lactate dehydrogenase     Status: None   Collection Time: 05/28/15 12:10 PM  Result Value Ref Range   LDH 146 98 - 192 U/L  Uric acid     Status: None   Collection Time: 05/28/15 12:10 PM  Result Value Ref Range  Uric Acid, Serum 3.0 2.3 - 6.6 mg/dL  Urine rapid drug screen (hosp performed)not at [facility]     Status: Abnormal   Collection Time: 05/28/15  2:10 PM  Result Value Ref Range   Opiates NONE DETECTED NONE DETECTED   Cocaine NONE DETECTED NONE DETECTED   Benzodiazepines POSITIVE (A) NONE DETECTED   Amphetamines POSITIVE (A) NONE DETECTED   Tetrahydrocannabinol POSITIVE (A) NONE DETECTED   Barbiturates NONE DETECTED NONE DETECTED  CBC     Status: Abnormal   Collection Time: 05/28/15  9:15 PM  Result Value Ref Range   WBC 11.3 (H) 4.0 - 10.5 K/uL   RBC 3.41 (L) 3.87 - 5.11 MIL/uL   Hemoglobin 9.8 (L) 12.0 - 15.0 g/dL   HCT 40.9 (L) 81.1 - 91.4 %   MCV 90.3 78.0 - 100.0 fL   MCH 28.7 26.0 - 34.0 pg   MCHC 31.8 30.0 - 36.0 g/dL   RDW 78.2 95.6 - 21.3 %   Platelets 243 150 - 400 K/uL  Comprehensive metabolic panel     Status: Abnormal   Collection Time: 05/28/15  9:15 PM  Result Value Ref Range   Sodium 134 (L) 135 - 145 mmol/L   Potassium 4.1 3.5 - 5.1 mmol/L   Chloride 105 101 - 111 mmol/L   CO2 23 22 - 32 mmol/L   Glucose, Bld 85 65 - 99 mg/dL   BUN 5 (L) 6 - 20 mg/dL   Creatinine, Ser  0.86 0.44 - 1.00 mg/dL   Calcium 8.4 (L) 8.9 - 10.3 mg/dL   Total Protein 5.5 (L) 6.5 - 8.1 g/dL   Albumin 2.5 (L) 3.5 - 5.0 g/dL   AST 24 15 - 41 U/L   ALT 17 14 - 54 U/L   Alkaline Phosphatase 183 (H) 38 - 126 U/L   Total Bilirubin 0.6 0.3 - 1.2 mg/dL   GFR calc non Af Amer >60 >60 mL/min   GFR calc Af Amer >60 >60 mL/min   Anion gap 6 5 - 15  Lactate dehydrogenase     Status: None   Collection Time: 05/28/15  9:15 PM  Result Value Ref Range   LDH 171 98 - 192 U/L  Uric acid     Status: None   Collection Time: 05/28/15  9:15 PM  Result Value Ref Range   Uric Acid, Serum 2.8 2.3 - 6.6 mg/dL    Assessment:  IUP at 35.4 wks IOL due to IUGR and oligo  preE w/o severe features; normal labs Cat 1 FHRT GBS positive Latent labor Anemia  Plan: Restart Pitocin 1x1 Restart PCN for GBS + prophylaxis with ROM or active labor SWS consult after delivery Expect progress and SVD Consult prn  Sherre Scarlet CNM 05/29/2015, 12:45 AM
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
## 2015-05-30 ENCOUNTER — Encounter (HOSPITAL_COMMUNITY): Payer: Self-pay | Admitting: Student

## 2015-05-30 DIAGNOSIS — F1510 Other stimulant abuse, uncomplicated: Secondary | ICD-10-CM | POA: Diagnosis present

## 2015-05-30 DIAGNOSIS — F1290 Cannabis use, unspecified, uncomplicated: Secondary | ICD-10-CM | POA: Diagnosis present

## 2015-05-30 DIAGNOSIS — F1590 Other stimulant use, unspecified, uncomplicated: Secondary | ICD-10-CM | POA: Diagnosis present

## 2015-05-30 MED ORDER — BENZOCAINE-MENTHOL 20-0.5 % EX AERO: 1.0000 "application " | INHALATION_SPRAY | CUTANEOUS | Status: DC | PRN

## 2015-05-30 MED ORDER — ZOLPIDEM TARTRATE 5 MG PO TABS
5.0000 mg | ORAL_TABLET | Freq: Every evening | ORAL | Status: DC | PRN
  Administered 2015-05-30: 5 mg via ORAL
  Filled 2015-05-30: qty 1

## 2015-05-30 MED ORDER — TETANUS-DIPHTH-ACELL PERTUSSIS 5-2.5-18.5 LF-MCG/0.5 IM SUSP: 0.5000 mL | Freq: Once | INTRAMUSCULAR | Status: DC

## 2015-05-30 MED ORDER — LAMOTRIGINE 25 MG PO TABS
50.0000 mg | ORAL_TABLET | Freq: Every day | ORAL | Status: DC
  Administered 2015-05-30 – 2015-06-01 (×3): 50 mg via ORAL
  Filled 2015-05-30 (×3): qty 2

## 2015-05-30 MED ORDER — LANOLIN HYDROUS EX OINT: TOPICAL_OINTMENT | CUTANEOUS | Status: DC | PRN

## 2015-05-30 MED ORDER — DIBUCAINE 1 % RE OINT: 1.0000 "application " | TOPICAL_OINTMENT | RECTAL | Status: DC | PRN

## 2015-05-30 MED ORDER — WITCH HAZEL-GLYCERIN EX PADS: 1.0000 "application " | MEDICATED_PAD | CUTANEOUS | Status: DC | PRN

## 2015-05-30 MED ORDER — OXYCODONE-ACETAMINOPHEN 5-325 MG PO TABS
1.0000 | ORAL_TABLET | ORAL | Status: DC | PRN
  Administered 2015-05-30 – 2015-06-01 (×6): 1 via ORAL
  Filled 2015-05-30 (×5): qty 1

## 2015-05-30 MED ORDER — FENTANYL CITRATE (PF) 100 MCG/2ML IJ SOLN
INTRAMUSCULAR | Status: AC
  Administered 2015-05-30: 100 ug via EPIDURAL
  Filled 2015-05-30: qty 2

## 2015-05-30 MED ORDER — ONDANSETRON HCL 4 MG PO TABS: 4.0000 mg | ORAL_TABLET | ORAL | Status: DC | PRN

## 2015-05-30 MED ORDER — ONDANSETRON HCL 4 MG/2ML IJ SOLN: 4.0000 mg | INTRAMUSCULAR | Status: DC | PRN

## 2015-05-30 MED ORDER — MISOPROSTOL 200 MCG PO TABS
ORAL_TABLET | ORAL | Status: AC
  Administered 2015-05-30: 1000 ug via RECTAL
  Filled 2015-05-30: qty 5

## 2015-05-30 MED ORDER — FERROUS SULFATE 325 (65 FE) MG PO TABS
325.0000 mg | ORAL_TABLET | Freq: Two times a day (BID) | ORAL | Status: DC
  Administered 2015-05-30 – 2015-06-01 (×5): 325 mg via ORAL
  Filled 2015-05-30 (×5): qty 1

## 2015-05-30 MED ORDER — OXYCODONE-ACETAMINOPHEN 5-325 MG PO TABS
2.0000 | ORAL_TABLET | ORAL | Status: DC | PRN
  Filled 2015-05-30: qty 2

## 2015-05-30 MED ORDER — ALPRAZOLAM 0.5 MG PO TABS
1.0000 mg | ORAL_TABLET | Freq: Three times a day (TID) | ORAL | Status: DC
  Administered 2015-05-30 – 2015-06-01 (×7): 1 mg via ORAL
  Filled 2015-05-30 (×7): qty 2

## 2015-05-30 MED ORDER — SENNOSIDES-DOCUSATE SODIUM 8.6-50 MG PO TABS
2.0000 | ORAL_TABLET | ORAL | Status: DC
  Administered 2015-05-30: 2 via ORAL
  Filled 2015-05-30 (×2): qty 2

## 2015-05-30 MED ORDER — ACETAMINOPHEN 325 MG PO TABS: 650.0000 mg | ORAL_TABLET | ORAL | Status: DC | PRN

## 2015-05-30 MED ORDER — MEASLES, MUMPS & RUBELLA VAC ~~LOC~~ INJ
0.5000 mL | INJECTION | Freq: Once | SUBCUTANEOUS | Status: DC
  Filled 2015-05-30: qty 0.5

## 2015-05-30 MED ORDER — PRENATAL MULTIVITAMIN CH
1.0000 | ORAL_TABLET | Freq: Every day | ORAL | Status: DC
  Administered 2015-05-30 – 2015-05-31 (×2): 1 via ORAL
  Filled 2015-05-30 (×2): qty 1

## 2015-05-30 MED ORDER — SIMETHICONE 80 MG PO CHEW
80.0000 mg | CHEWABLE_TABLET | ORAL | Status: DC | PRN
Start: 2015-05-30 — End: 2015-06-01

## 2015-05-30 MED ORDER — NALBUPHINE SYRINGE 5 MG/0.5 ML: 10.0000 mg | INJECTION | Freq: Once | INTRAMUSCULAR | Status: DC

## 2015-05-30 MED ORDER — PROMETHAZINE HCL 25 MG/ML IJ SOLN: 25.0000 mg | Freq: Once | INTRAMUSCULAR | Status: DC

## 2015-05-30 MED ORDER — MISOPROSTOL 200 MCG PO TABS
1000.0000 ug | ORAL_TABLET | ORAL | Status: AC
  Administered 2015-05-30: 1000 ug via RECTAL

## 2015-05-30 MED ORDER — DIPHENHYDRAMINE HCL 25 MG PO CAPS: 25.0000 mg | ORAL_CAPSULE | Freq: Four times a day (QID) | ORAL | Status: DC | PRN

## 2015-05-30 MED ORDER — LACTATED RINGERS IV SOLN
INTRAVENOUS | Status: DC
  Administered 2015-05-30: 01:00:00 via INTRAUTERINE

## 2015-05-30 MED ORDER — IBUPROFEN 600 MG PO TABS
600.0000 mg | ORAL_TABLET | Freq: Four times a day (QID) | ORAL | Status: DC
  Administered 2015-05-30 – 2015-06-01 (×9): 600 mg via ORAL
  Filled 2015-05-30 (×9): qty 1

## 2015-05-30 NOTE — Progress Notes (Signed)
CNM called and notified of FHR and pt's c/o vaginal pressure with contractions. Orders received to perform SVE

## 2015-05-30 NOTE — Progress Notes (Signed)
Amnioinfusion bolus completed. Moderate amount of clear fluid return noted. Continuous infusion started at 150 mL/hr per CNM orders.
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
## 2015-05-30 NOTE — Progress Notes (Signed)
Antihypertensive med administered per orders.

## 2015-05-30 NOTE — Progress Notes (Signed)
Dr. Maple Hudson at bedside. Dermatome level assessed and plan of car to replace epidural catheter discussed. Pt does not participate in discussion and displays signs of frustration by covering her face and answering curtly. Pain management options reviewed and pt allowed time to consider all options.

## 2015-05-30 NOTE — Progress Notes (Signed)
Pt assisted to BR for initial PP void. Pt ambulated safely, void of urine noted, pt educated on perineal hygiene and can return correct demo. Pt asisted to w/c safely and prepped for routine transfer to M/B.  

## 2015-05-30 NOTE — Lactation Note (Signed)
Lactation Consultation Note  Initial consult with baby in NICU. Mom reports she does not wish to BF and does not wish to provide breastmilk for her infant.   Patient Name: Kristi Perkins [id number] Date: 05/30/2015     Maternal Data    Feeding    LATCH Score/Interventions                      Lactation Tools Discussed/Used     Consult Status      Ed Blalock 05/30/2015, 9:48 AM
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
## 2015-05-30 NOTE — Progress Notes (Signed)
Pt asymptomatic, RN remains at bedside.

## 2015-05-30 NOTE — Progress Notes (Signed)
BP of 154/110 noted after initial dose via epidural cath. BP outside of parameters, antihypertensive held at this time. BP to be re-assessed in 5 min. RN remains at bedside.

## 2015-05-30 NOTE — Anesthesia Postprocedure Evaluation (Signed)
Anesthesia Post Note  Patient: Kristi Perkins  Procedure(s) Performed: * No procedures listed *  Anesthesia type: Epidural  Patient location: Mother/Baby  Post pain: Pain level controlled  Post assessment: Post-op Vital signs reviewed  Last Vitals:  Filed Vitals:   05/30/15 1037  BP: 127/82  Pulse: 90  Temp: 36.9 C  Resp: 16    Post vital signs: Reviewed  Level of consciousness:alert  Complications: No apparent anesthesia complications

## 2015-05-30 NOTE — Progress Notes (Signed)
Epidural dosed by Dr. Maple Hudson.

## 2015-05-30 NOTE — Progress Notes (Signed)
Dr. Maple Hudson called and informed of pt's complaint of increased pain and intolerance to SVE. Pt reports that she " feels everything". MD to assess at bedside.

## 2015-05-31 LAB — CBC
HEMATOCRIT: 19.6 % — AB (ref 36.0–46.0)
Hemoglobin: 6.4 g/dL — CL (ref 12.0–15.0)
MCH: 29.4 pg (ref 26.0–34.0)
MCHC: 32.7 g/dL (ref 30.0–36.0)
MCV: 89.9 fL (ref 78.0–100.0)
Platelets: 209 10*3/uL (ref 150–400)
RBC: 2.18 MIL/uL — AB (ref 3.87–5.11)
RDW: 14.6 % (ref 11.5–15.5)
WBC: 11.4 10*3/uL — AB (ref 4.0–10.5)

## 2015-05-31 NOTE — Progress Notes (Signed)
Kristi Perkins   Subjective: Post Partum Day 1 Vaginal delivery, no laceration Patient up ad lib, denies syncope or dizziness. Reports consuming regular diet without issues and denies N/V No issues with urination and reports bleeding is appropriate  Feeding:  bottle Contraceptive plan:   Unsure  Discussed Hgb level and the recommendation for a transfusion.  S/S of severe anemia reviewed including SOB, HA, chest pain, weakness, dizziness, pt declined a transfusion at this time states "i will think about it".   Rubella non immune status reviewed.  Pt declined MMR  Objective: Temp:  [97.4 F (36.3 C)-98.4 F (36.9 C)] 98.4 F (36.9 C) (10/04 0527) Pulse Rate:  [84-100] 84 (10/04 0527) Resp:  [18] 18 (10/04 0527) BP: (112-127)/(68-84) 112/68 mmHg (10/04 0527) SpO2:  [100 %] 100 % (10/04 0527)  Physical Exam:  General: alert, cooperative and flat affect Ext: WNL, no significant  edema. No evidence of DVT seen on physical exam. 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    

## 2015-05-31 NOTE — Progress Notes (Signed)
CSW received phone call from [facility] requesting time to meet.  MOB requested to speak with C.Brigitte Pulse, [place] that met with her on 10/3. MOB verbalized understanding that C.Brigitte Pulse is unavailable today, and will meet with this CSW at 12:30pm.

## 2015-05-31 NOTE — Progress Notes (Signed)
CSW met with MOB for approximately 75 minutes. MOB presented as tearful and anxious throughout the visit. She was noted to be crying frequently, and was observed to have her hands shaking and sitting with a knees close to body. MOB openly processed her high levels of anxiety due to recent CPS report. She stated that she was contacted by a CPS worker today and that a home visit was arranged for later this afternoon. Per MOB, the FOB is currently at home preparing for the visit, and she stated that "my house is a mess".  CSW attempted to explore with MOB what CPS is assessing for when a home visit is completed, but MOB reported that the FOB will be unable to adequately clean and prepare.  MOB continued to express fear that CPS will inform FOB of her UDS upon this hospital admission. She shared that the FOB does not know that she engaged in [facility] use, and she is worried that he will leave her if he becomes aware.  MOB shared that she does not want to inform him of her use, and reported that she is confident that there is no potential for a positive outcome if she were to inform him prior to CPS.  MOB expressed ongoing fear that she will be blamed for "our family falling apart", and shared that she is anxious about being accused to be a "drug addict" based on her +UDS for THC, benzodiazepines, and amphetamines.  During full CSW assessment with C.Brigitte Pulse, MOB was unable to identify any substance that may have contributed to a +UDS for amphetamines.  MOB brought to this CSW a list of over the counter medications that she took during the pregnancy.  CSW unable to identify any medications that may explain reason for +UDS.  The MOB shared that she has contacted a friend who gave her a pill that she thought was Xanax in order to inquire about potential for different medication being given by mistake. During the visit with the CSW, MOB received text message from friend reporting that it was an Adderall. MOB did not identify  learning this information as helpful or beneficial, and became more tearful once she learned that she was given Adderall.  MOB acknowledged that she cannot go back and change the past, and it appeared difficult to focus on what is within her control. MOB acknowledged that she can only control her reactions and how to move forward, but she struggled to identify how to best move forward.  CSW attempted to assist the MOB to self-regulate, but MOB struggled to identify any techniques that may assist her to reduce feelings of anxiety.  The MOB was highly engaged in rigid thought patterns which led to her engaging in negative fortune telling and catastrophizing all future scenarios.  MOB was unable to identify any evidence that would demonstrate that her anxious thoughts are false.  The MOB continued to remain highly engaged in her thougths and fears that the FOB will leave her and that CPS will remove her children from her.  MOB was unable to identify any personal strengths that she wants to demonstrate to CPS despite CSW reviewing with MOB her persistence and perseverance.    CSW will continue to follow in order to continue provide psychosocial support to MOB and in order to receive discharge recommendations from CPS.

## 2015-05-31 NOTE — Clinical Documentation Improvement (Signed)
OB/GYN  "Anemia" is documented in the current medical record.    Please document if a condition below provides greater specificity regarding the patient's anemia:  - Acute Blood Loss Anemia  - Other Condition  - Unable to clinically determine  Clinical Information: H&H 6.4/19.3 on 05/31/15 EBL during delivery - 457 ml.  (please document your response in the progress notes and not on the query form itself.)   Please exercise your independent, professional judgment when responding. A specific answer is not anticipated or expected.   Thank You,  Jerral Ralph  RN BSN CCDS [phone] Health Information [facility] [place]

## 2015-05-31 NOTE — Progress Notes (Signed)
CRITICAL VALUE ALERT  Critical value received:  Hgb 6.4  Date of notification:  05/31/2015  Time of notification: 0650  Critical value read back: yes  Nurse who received alert:  Glory Rosebush  MD notified (1st page):  Gerrit Heck, CNM  Time of first page:  [phone]  MD notified (2nd page): n/a  Time of second page: n/a  Responding MD:  Gerrit Heck, CNM  Time MD responded:  [phone]

## 2015-06-01 ENCOUNTER — Encounter (HOSPITAL_COMMUNITY): Payer: Self-pay | Admitting: Certified Nurse Midwife

## 2015-06-01 LAB — CBC
HCT: 20.4 % — ABNORMAL LOW (ref 36.0–46.0)
HEMOGLOBIN: 6.4 g/dL — AB (ref 12.0–15.0)
MCH: 29.1 pg (ref 26.0–34.0)
MCHC: 31.9 g/dL (ref 30.0–36.0)
MCV: 91.5 fL (ref 78.0–100.0)
Platelets: 289 10*3/uL (ref 150–400)
RBC: 2.23 MIL/uL — ABNORMAL LOW (ref 3.87–5.11)
RDW: 14.9 % (ref 11.5–15.5)
WBC: 11.4 10*3/uL — ABNORMAL HIGH (ref 4.0–10.5)

## 2015-06-01 MED ORDER — FERROUS SULFATE 325 (65 FE) MG PO TABS: 325.0000 mg | ORAL_TABLET | Freq: Two times a day (BID) | ORAL | Status: DC

## 2015-06-01 MED ORDER — OXYCODONE-ACETAMINOPHEN 5-325 MG PO TABS
1.0000 | ORAL_TABLET | ORAL | Status: DC | PRN
Start: 2015-06-01 — End: 2015-06-01

## 2015-06-01 MED ORDER — OXYCODONE-ACETAMINOPHEN 5-325 MG PO TABS: 1.0000 | ORAL_TABLET | ORAL | Status: DC | PRN

## 2015-06-01 MED ORDER — IBUPROFEN 600 MG PO TABS: 600.0000 mg | ORAL_TABLET | Freq: Four times a day (QID) | ORAL | Status: DC | PRN

## 2015-06-01 NOTE — Discharge Summary (Signed)
Vaginal Delivery Discharge Summary  Kristi Perkins  DOB:    09-10-1985 MRN:    [id number] CSN:    [id number]  Date of admission:                 05/27/15  Date of discharge:                   06/01/15  Procedures this admission:   IOL with cytotec, foley bulb, AROM/Pitocin, SVD    Date of Delivery:  05/30/15  Newborn Data:  Live born female  Birth Weight: 4 lb 4 oz (1928 g) APGAR: 8, 9  Baby in NICU at time of Mom's discharge due to IUGR Name:  "[name]"  Circumcision Plan:  No circ.   History of Present Illness:  [name] is a 29 y.o. female, [id number], who presents at 73w5dweeks gestation. [facility] She was admitted for induction of labor for IUGR, Oligo, and mild preeclampsia.  Her pregnancy has been complicated by:  Patient Active Problem List   Diagnosis Date Noted  . Vaginal delivery 05/30/2015  . Marijuana use (noted on UDS on 05/28/15) 05/30/2015  . 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 Rubella non-immune status, antepartum 05/27/2015  . IUGR (intrauterine growth restriction)--EFW 1732 gm, 3+9, < 10%ile. 05/27/2015  . Anemia--8.6 on 05/11/15. 05/27/2015  . Group beta Strep positive 05/10/2015  . B[place] 1 disorder ([facility] 05/06/[place]  . H/O drug abuse in past 05/06/2015  . Body mass index (BMI) of 20.0-20.9 in adult 05/06/2015  . Hernia 05/06/2015  . Short stature 05/04/2012  . Depression 12/26/2011  . Anxiety 12/26/2011     Hospital Course:   Admitting Dx:  IUP at 3235.7wks, IUGR, Oligo, and preeclampsia without severe features GBS Status:  Positive  Delivering Clinician:  KFarrel Gordon CNM Lacerations/MLE: None Complications: Postpartum anemia  Due to acute blood loss, SW consult with CPS notification, Infant in NICU,  Intrapartum Procedures: spontaneous vaginal delivery and GBS  prophylaxis Postpartum Procedures: none  ,    Additional Information:   Will continue to take Lamictal and Xanax after discharge Rubella non-immune-Declined MMR.  Did not require antihypertensives or magnesium during her stay Predelivery Hgb 10.0, postpartum 6.4 on 10/3 and 10/4. Pt declined blood transfusion, desired to continue iron tablets and asked for recommendations on Iron rich foods.   Agreed to return to hospital for blood transfusion if symptoms increase.     Discharge Diagnoses: Term Pregnancy-delivered ,  IUGR, Oligo, preeclampsia, acute blood loss anemia  Feeding:  bottle  Contraception:  undecided   Hemoglobin Results:  CBC Latest Ref Rng 06/01/2015 05/31/2015 05/29/2015  WBC 4.0 - 10.5 K/uL 11.4(H) 11.4(H) 13.1(H)  Hemoglobin 12.0 - 15.0 g/dL 6.4(LL) 6.4(LL) 10.0(L)  Hematocrit 36.0 - 46.0 % 20.4(L) 19.6(L) 31.2(L)  Platelets 150 - 400 K/uL 289 209 219   Filed Vitals:   06/01/15 0548 06/01/15 0820 06/01/15 0823 06/01/15 0827  BP: 130/78 142/95 133/88 133/94  Pulse: 87 96 115 100  Temp: 98 F (36.7 C)     TempSrc: Oral     Resp: 15     Height:      Weight:      SpO2: 100%       Discharge Physical Exam:   General: alert Lochia: appropriate Uterine Fundus: firm, normal involution DVT Evaluation: No evidence of DVT seen on physical exam.  Discharge Information:  Activity:           pelvic rest Diet:                routine Medications: Ibuprofen, Iron and Percocet Condition:      stable Instructions:  Routine pp instructions - postpartum, Anemia, Iron Rich foods   Discharge to: home   Lavetta Nielsen CNM 06/01/2015 10:40 AM

## 2015-06-01 NOTE — Progress Notes (Signed)
Patient verbalizes understanding of discharge instructions, follow up appointments, medications and when to seek medical care. No IV at time of discharge. Vital signs stable at discharge. [facility] [employer] Nurse was notified of need for visit. Patient husband at bedside, will be driving patient home. Encouraged patient to check room thoroughly prior to discharge. Patient escorted to main entrance by nurse tech.

## 2015-06-01 NOTE — Plan of Care (Signed)
Problem: Discharge Progression Outcomes Goal: MMR given as ordered Outcome: Not Met (add Reason) Patient denies.

## 2015-06-01 NOTE — Plan of Care (Signed)
Problem: Discharge Progression Outcomes Goal: MMR given as ordered Outcome: Not Applicable Date Met:  73/22/56 Patient refused.

## 2015-06-01 NOTE — Progress Notes (Signed)
CRITICAL VALUE ALERT  Critical value received:  HgB 6.4  Date of notification:  06/01/15  Time of notification:  0854  Critical value read back:Yes.    Nurse who received alert:  A. Roger Shelter, RN  MD notified (1st page):  Fleet Contras, CNM  Time of first page:  0900  MD notified (2nd page):  Time of second page:  Responding MD:  Lerry Liner  Time MD responded: 0900

## 2015-06-01 NOTE — Discharge Instructions (Signed)
Postpartum Care After Vaginal Delivery °After you deliver your newborn (postpartum period), the usual stay in the hospital is 24-72 hours. If there were problems with your labor or delivery, or if you have other medical problems, you might be in the hospital longer.  °While you are in the hospital, you will receive help and instructions on how to care for yourself and your newborn during the postpartum period.  °While you are in the hospital: °· Be sure to tell your nurses if you have pain or discomfort, as well as where you feel the pain and what makes the pain worse. °· If you had an incision made near your vagina (episiotomy) or if you had some tearing during delivery, the nurses may put ice packs on your episiotomy or tear. The ice packs may help to reduce the pain and swelling. °· If you are breastfeeding, you may feel uncomfortable contractions of your uterus for a couple of weeks. This is normal. The contractions help your uterus get back to normal size. °· It is normal to have some bleeding after delivery. °· For the first 1-3 days after delivery, the flow is red and the amount may be similar to a period. °· It is common for the flow to start and stop. °· In the first few days, you may pass some small clots. Let your nurses know if you begin to pass large clots or your flow increases. °· Do not  flush blood clots down the toilet before having the nurse look at them. °· During the next 3-10 days after delivery, your flow should become more watery and pink or brown-tinged in color. °· Ten to fourteen days after delivery, your flow should be a small amount of yellowish-white discharge. °· The amount of your flow will decrease over the first few weeks after delivery. Your flow may stop in 6-8 weeks. Most women have had their flow stop by 12 weeks after delivery. °· You should change your sanitary pads frequently. °· Wash your hands thoroughly with soap and water for at least 20 seconds after changing pads, using  the toilet, or before holding or feeding your newborn. °· You should feel like you need to empty your bladder within the first 6-8 hours after delivery. °· In case you become weak, lightheaded, or faint, call your nurse before you get out of bed for the first time and before you take a shower for the first time. °· Within the first few days after delivery, your breasts may begin to feel tender and full. This is called engorgement. Breast tenderness usually goes away within 48-72 hours after engorgement occurs. You may also notice milk leaking from your breasts. If you are not breastfeeding, do not stimulate your breasts. Breast stimulation can make your breasts produce more milk. °· Spending as much time as possible with your newborn is very important. During this time, you and your newborn can feel close and get to know each other. Having your newborn stay in your room (rooming in) will help to strengthen the bond with your newborn.  It will give you time to get to know your newborn and become comfortable caring for your newborn. °· Your hormones change after delivery. Sometimes the hormone changes can temporarily cause you to feel sad or tearful. These feelings should not last more than a few days. If these feelings last longer than that, you should talk to your caregiver. °· If desired, talk to your caregiver about methods of family planning or contraception. °·   Talk to your caregiver about immunizations. Your caregiver may want you to have the following immunizations before leaving the hospital:  Tetanus, diphtheria, and pertussis (Tdap) or tetanus and diphtheria (Td) immunization. It is very important that you and your family (including grandparents) or others caring for your newborn are up-to-date with the Tdap or Td immunizations. The Tdap or Td immunization can help protect your newborn from getting ill.  Rubella immunization.  Varicella (chickenpox) immunization.  Influenza immunization. You should  receive this annual immunization if you did not receive the immunization during your pregnancy.   This information is not intended to replace advice given to you by your health care provider. Make sure you discuss any questions you have with your health care provider.   Document Released: 06/10/2007 Document Revised: 05/07/2012 Document Reviewed: 04/09/2012 Elsevier Interactive Patient Education 2016 [employer].   Iron Deficiency Anemia, Adult Anemia is when you have a low number of healthy red blood cells. It is often caused by too little iron. This is called iron deficiency anemia. It may make you tired and short of breath. HOME CARE   Take iron as told by your doctor.  Take vitamins as told by your doctor.  Eat foods that have iron in them. This includes liver, lean beef, whole-grain bread, eggs, dried fruit, and dark green leafy vegetables. GET HELP RIGHT AWAY IF:  You pass out (faint).  You have chest pain.  You feel sick to your stomach (nauseous) or throw up (vomit).  You get very short of breath with activity.  You are weak.  You have a fast heartbeat.  You start to sweat for no reason.  You become light-headed when getting up from a chair or bed. MAKE SURE YOU:  Understand these instructions.  Will watch your condition.  Will get help right away if you are not doing well or get worse.   This information is not intended to replace advice given to you by your health care provider. Make sure you discuss any questions you have with your health care provider.   Document Released: 09/15/2010 Document Revised: 09/03/2014 Document Reviewed: 04/20/2013 Elsevier Interactive Patient Education 2016 [employer].  Iron-Rich Diet Iron is a mineral that helps your body to produce hemoglobin. Hemoglobin is a protein in your red blood cells that carries oxygen to your body's tissues. Eating too little iron may cause you to feel weak and tired, and it can increase your risk  for infection. Eating enough iron is necessary for your body's metabolism, muscle function, and nervous system. Iron is naturally found in many foods. It can also be added to foods or fortified in foods. There are two types of dietary iron:  Heme iron. Heme iron is absorbed by the body more easily than nonheme iron. Heme iron is found in meat, poultry, and fish.  Nonheme iron. Nonheme iron is found in dietary supplements, iron-fortified grains, beans, and vegetables. You may need to follow an iron-rich diet if:  You have been diagnosed with iron deficiency or iron-deficiency anemia.  You have a condition that prevents you from absorbing dietary iron, such as:  Infection in your intestines.  Celiac disease. This involves long-lasting (chronic) inflammation of your intestines.  You do not eat enough iron.  You eat a diet that is high in foods that impair iron absorption.  You have lost a lot of blood.  You have heavy bleeding during your menstrual cycle.  You are pregnant. WHAT IS MY PLAN? Your health care  provider may help you to determine how much iron you need per day based on your condition. Generally, when a person consumes sufficient amounts of iron in the diet, the following iron needs are met:  Men.  76-65 years old: 11 mg per day.  93-29 years old: 8 mg per day.  Women.   66-72 years old: 15 mg per day.  20-68 years old: 18 mg per day.  Over 42 years old: 8 mg per day.  Pregnant women: 27 mg per day.  Breastfeeding women: 9 mg per day. WHAT DO I NEED TO KNOW ABOUT AN IRON-RICH DIET?  Eat fresh fruits and vegetables that are high in vitamin C along with foods that are high in iron. This will help increase the amount of iron that your body absorbs from food, especially with foods containing nonheme iron. Foods that are high in vitamin C include oranges, peppers, tomatoes, and mango.  Take iron supplements only as directed by your health care provider. Overdose  of iron can be life-threatening. If you were prescribed iron supplements, take them with orange juice or a vitamin C supplement.  Cook foods in pots and pans that are made from iron.   Eat nonheme iron-containing foods alongside foods that are high in heme iron. This helps to improve your iron absorption.   Certain foods and drinks contain compounds that impair iron absorption. Avoid eating these foods in the same meal as iron-rich foods or with iron supplements. These include:  Coffee, black tea, and red wine.  Milk, dairy products, and foods that are high in calcium.  Beans, soybeans, and peas.  Whole grains.  When eating foods that contain both nonheme iron and compounds that impair iron absorption, follow these tips to absorb iron better.   Soak beans overnight before cooking.  Soak whole grains overnight and drain them before using.  Ferment flours before baking, such as using yeast in bread dough. WHAT FOODS CAN I EAT? Grains Iron-fortified breakfast cereal. Iron-fortified whole-wheat bread. Enriched rice. Sprouted grains. Vegetables Spinach. Potatoes with skin. Green peas. Broccoli. Red and green bell peppers. Fermented vegetables. Fruits Prunes. Raisins. Oranges. Strawberries. Mango. Grapefruit. Meats and Other Protein Sources Beef liver. Oysters. Beef. Shrimp. [place]. Chicken. [place]. Sardines. Chickpeas. Nuts. Tofu. Beverages Tomato juice. Fresh orange juice. Prune juice. Hibiscus tea. Fortified instant breakfast shakes. Condiments Tahini. Fermented soy sauce. Sweets and Desserts Black-strap molasses.  Other Wheat germ. The items listed above may not be a complete list of recommended foods or beverages. Contact your dietitian for more options. WHAT FOODS ARE NOT RECOMMENDED? Grains Whole grains. Bran cereal. Bran flour. Oats. Vegetables Artichokes. Brussels sprouts. Kale. Fruits Blueberries. Raspberries. Strawberries. Figs. Meats and Other  Protein Sources Soybeans. Products made from soy protein. Dairy Milk. Cream. Cheese. Yogurt. Cottage cheese. Beverages Coffee. Black tea. Red wine. Sweets and Desserts Cocoa. Chocolate. Ice cream. Other Basil. Oregano. Parsley. The items listed above may not be a complete list of foods and beverages to avoid. Contact your dietitian for more information.   This information is not intended to replace advice given to you by your health care provider. Make sure you discuss any questions you have with your health care provider.   Document Released: 03/27/2005 Document Revised: 09/03/2014 Document Reviewed: 03/10/2014 Elsevier Interactive Patient Education [employer].

## 2017-06-29 ENCOUNTER — Encounter (HOSPITAL_COMMUNITY): Payer: Self-pay | Admitting: Emergency Medicine

## 2017-06-29 ENCOUNTER — Emergency Department (HOSPITAL_COMMUNITY)
Admission: EM | Admit: 2017-06-29 | Discharge: 2017-06-30 | Disposition: A | Discharge status: Other Acute Inpatient Hospital | Payer: Federal, State, Local not specified - Other | Attending: Emergency Medicine | Admitting: Emergency Medicine

## 2017-06-29 ENCOUNTER — Emergency Department (HOSPITAL_COMMUNITY): Payer: Self-pay

## 2017-06-29 DIAGNOSIS — M791 Myalgia, unspecified site: Secondary | ICD-10-CM | POA: Insufficient documentation

## 2017-06-29 DIAGNOSIS — F332 Major depressive disorder, recurrent severe without psychotic features: Secondary | ICD-10-CM | POA: Diagnosis present

## 2017-06-29 DIAGNOSIS — F1410 Cocaine abuse, uncomplicated: Secondary | ICD-10-CM | POA: Insufficient documentation

## 2017-06-29 DIAGNOSIS — R45851 Suicidal ideations: Secondary | ICD-10-CM | POA: Insufficient documentation

## 2017-06-29 DIAGNOSIS — Z046 Encounter for general psychiatric examination, requested by authority: Secondary | ICD-10-CM | POA: Insufficient documentation

## 2017-06-29 DIAGNOSIS — Y929 Unspecified place or not applicable: Secondary | ICD-10-CM | POA: Insufficient documentation

## 2017-06-29 DIAGNOSIS — Y9389 Activity, other specified: Secondary | ICD-10-CM | POA: Insufficient documentation

## 2017-06-29 DIAGNOSIS — Z79899 Other long term (current) drug therapy: Secondary | ICD-10-CM | POA: Insufficient documentation

## 2017-06-29 DIAGNOSIS — Y999 Unspecified external cause status: Secondary | ICD-10-CM | POA: Insufficient documentation

## 2017-06-29 DIAGNOSIS — Z87891 Personal history of nicotine dependence: Secondary | ICD-10-CM | POA: Insufficient documentation

## 2017-06-29 DIAGNOSIS — R110 Nausea: Secondary | ICD-10-CM | POA: Insufficient documentation

## 2017-06-29 DIAGNOSIS — S0083XA Contusion of other part of head, initial encounter: Secondary | ICD-10-CM | POA: Insufficient documentation

## 2017-06-29 DIAGNOSIS — S2242XA Multiple fractures of ribs, left side, initial encounter for closed fracture: Secondary | ICD-10-CM | POA: Insufficient documentation

## 2017-06-29 DIAGNOSIS — R05 Cough: Secondary | ICD-10-CM | POA: Insufficient documentation

## 2017-06-29 LAB — COMPREHENSIVE METABOLIC PANEL
ALT: 25 U/L (ref 14–54)
AST: 51 U/L — AB (ref 15–41)
Albumin: 3.4 g/dL — ABNORMAL LOW (ref 3.5–5.0)
Alkaline Phosphatase: 137 U/L — ABNORMAL HIGH (ref 38–126)
Anion gap: 11 (ref 5–15)
BUN: 7 mg/dL (ref 6–20)
CHLORIDE: 102 mmol/L (ref 101–111)
CO2: 24 mmol/L (ref 22–32)
Calcium: 8.7 mg/dL — ABNORMAL LOW (ref 8.9–10.3)
Creatinine, Ser: 1.11 mg/dL — ABNORMAL HIGH (ref 0.44–1.00)
Glucose, Bld: 63 mg/dL — ABNORMAL LOW (ref 65–99)
POTASSIUM: 3.2 mmol/L — AB (ref 3.5–5.1)
Sodium: 137 mmol/L (ref 135–145)
Total Bilirubin: 0.5 mg/dL (ref 0.3–1.2)
Total Protein: 7.5 g/dL (ref 6.5–8.1)

## 2017-06-29 LAB — RAPID URINE DRUG SCREEN, HOSP PERFORMED
Amphetamines: POSITIVE — AB
BARBITURATES: NOT DETECTED
Benzodiazepines: NOT DETECTED
COCAINE: POSITIVE — AB
Opiates: NOT DETECTED
TETRAHYDROCANNABINOL: POSITIVE — AB

## 2017-06-29 LAB — SALICYLATE LEVEL

## 2017-06-29 LAB — ACETAMINOPHEN LEVEL

## 2017-06-29 LAB — CBC
HEMATOCRIT: 35.7 % — AB (ref 36.0–46.0)
HEMOGLOBIN: 10.9 g/dL — AB (ref 12.0–15.0)
MCH: 26.7 pg (ref 26.0–34.0)
MCHC: 30.5 g/dL (ref 30.0–36.0)
MCV: 87.5 fL (ref 78.0–100.0)
Platelets: 249 10*3/uL (ref 150–400)
RBC: 4.08 MIL/uL (ref 3.87–5.11)
RDW: 19.6 % — ABNORMAL HIGH (ref 11.5–15.5)
WBC: 9.5 10*3/uL (ref 4.0–10.5)

## 2017-06-29 LAB — PREGNANCY, URINE: PREG TEST UR: NEGATIVE

## 2017-06-29 LAB — CBG MONITORING, ED: GLUCOSE-CAPILLARY: 91 mg/dL (ref 65–99)

## 2017-06-29 LAB — ETHANOL: ALCOHOL ETHYL (B): 82 mg/dL — AB (ref <10)

## 2017-06-29 MED ORDER — IBUPROFEN 200 MG PO TABS
600.0000 mg | ORAL_TABLET | Freq: Three times a day (TID) | ORAL | Status: DC | PRN
  Administered 2017-06-29 – 2017-06-30 (×2): 600 mg via ORAL
  Filled 2017-06-29 (×2): qty 3

## 2017-06-29 MED ORDER — LORAZEPAM 2 MG/ML IJ SOLN: 0.0000 mg | Freq: Two times a day (BID) | INTRAMUSCULAR | Status: DC

## 2017-06-29 MED ORDER — ALUM & MAG HYDROXIDE-SIMETH 200-200-20 MG/5ML PO SUSP: 30.0000 mL | Freq: Four times a day (QID) | ORAL | Status: DC | PRN

## 2017-06-29 MED ORDER — ACETAMINOPHEN 500 MG PO TABS: 1000.0000 mg | ORAL_TABLET | Freq: Four times a day (QID) | ORAL | Status: DC | PRN

## 2017-06-29 MED ORDER — VITAMIN B-1 100 MG PO TABS
100.0000 mg | ORAL_TABLET | Freq: Every day | ORAL | Status: DC
  Administered 2017-06-29 – 2017-06-30 (×2): 100 mg via ORAL
  Filled 2017-06-29 (×2): qty 1

## 2017-06-29 MED ORDER — ZOLPIDEM TARTRATE 5 MG PO TABS: 5.0000 mg | ORAL_TABLET | Freq: Every evening | ORAL | Status: DC | PRN

## 2017-06-29 MED ORDER — LORAZEPAM 2 MG/ML IJ SOLN: 0.0000 mg | Freq: Four times a day (QID) | INTRAMUSCULAR | Status: DC

## 2017-06-29 MED ORDER — NICOTINE 21 MG/24HR TD PT24
21.0000 mg | MEDICATED_PATCH | Freq: Every day | TRANSDERMAL | Status: DC
  Administered 2017-06-29 – 2017-06-30 (×2): 21 mg via TRANSDERMAL
  Filled 2017-06-29 (×2): qty 1

## 2017-06-29 MED ORDER — LORAZEPAM 1 MG PO TABS
0.0000 mg | ORAL_TABLET | Freq: Two times a day (BID) | ORAL | Status: DC
Start: 2017-07-01 — End: 2017-06-30

## 2017-06-29 MED ORDER — ALPRAZOLAM 1 MG PO TABS: 1.0000 mg | ORAL_TABLET | Freq: Three times a day (TID) | ORAL | Status: DC | PRN

## 2017-06-29 MED ORDER — ONDANSETRON HCL 4 MG PO TABS
4.0000 mg | ORAL_TABLET | Freq: Three times a day (TID) | ORAL | Status: DC | PRN
  Administered 2017-06-29: 4 mg via ORAL
  Filled 2017-06-29: qty 1

## 2017-06-29 MED ORDER — LORAZEPAM 1 MG PO TABS
0.0000 mg | ORAL_TABLET | Freq: Four times a day (QID) | ORAL | Status: DC
  Administered 2017-06-29 – 2017-06-30 (×2): 1 mg via ORAL
  Filled 2017-06-29 (×2): qty 1

## 2017-06-29 MED ORDER — POTASSIUM CHLORIDE CRYS ER 20 MEQ PO TBCR
40.0000 meq | EXTENDED_RELEASE_TABLET | Freq: Once | ORAL | Status: AC
  Administered 2017-06-29: 40 meq via ORAL
  Filled 2017-06-29: qty 2

## 2017-06-29 MED ORDER — THIAMINE HCL 100 MG/ML IJ SOLN: 100.0000 mg | Freq: Every day | INTRAMUSCULAR | Status: DC

## 2017-06-29 MED ORDER — SODIUM CHLORIDE 0.9 % IV BOLUS (SEPSIS)
1000.0000 mL | Freq: Once | INTRAVENOUS | Status: AC
  Administered 2017-06-29: 1000 mL via INTRAVENOUS
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
## 2017-06-29 NOTE — ED Notes (Signed)
Hourly rounding reveals patient in room. Pt. Complains of nausea, body aches, stable, in no acute distress. Q15 minute rounds and monitoring via [employer] to continue.

## 2017-06-29 NOTE — ED Notes (Signed)
Report to include Situation, Background, Assessment, and Recommendations received from Diane RN. Patient alert and oriented, warm and dry, in no acute distress. Patient denies SI, HI, AVH and pain. Patient made aware of Q15 minute rounds and security cameras for their safety. Patient instructed to come to me with needs or concerns. 

## 2017-06-29 NOTE — BH Assessment (Signed)
[facility] Assessment Progress Note  Case was staffed with Lord DNP who recommended a inpatient admission as appropriate bed placement is investigated.         

## 2017-06-29 NOTE — ED Notes (Signed)
Hourly rounding reveals patient sleeping in room. No complaints, stable, in no acute distress. Q15 minute rounds and monitoring via Security Cameras to continue. 
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
## 2017-06-30 DIAGNOSIS — F332 Major depressive disorder, recurrent severe without psychotic features: Secondary | ICD-10-CM

## 2017-06-30 DIAGNOSIS — R45851 Suicidal ideations: Secondary | ICD-10-CM

## 2017-06-30 MED ORDER — GABAPENTIN 300 MG PO CAPS
300.0000 mg | ORAL_CAPSULE | Freq: Three times a day (TID) | ORAL | Status: DC
  Administered 2017-06-30 (×3): 300 mg via ORAL
  Filled 2017-06-30 (×3): qty 1

## 2017-06-30 MED ORDER — CARBAMAZEPINE ER 200 MG PO TB12
200.0000 mg | ORAL_TABLET | Freq: Two times a day (BID) | ORAL | Status: DC
  Administered 2017-06-30 (×2): 200 mg via ORAL
  Filled 2017-06-30 (×2): qty 1

## 2017-06-30 MED ORDER — CITALOPRAM HYDROBROMIDE 10 MG PO TABS
10.0000 mg | ORAL_TABLET | Freq: Every day | ORAL | Status: DC
  Administered 2017-06-30: 10 mg via ORAL
  Filled 2017-06-30: qty 1

## 2017-06-30 NOTE — Progress Notes (Signed)
Per NP/Provider, pt is appropriate for MH/SA psychiatric inpatient admission.  CSW faxed out referrals to and received fax confirmations from the following facilities:  1. [facility] 2. [facility] 3. [facility]  Please reconsult if future social work needs arise.  CSW signing off, as social work intervention is no longer needed.  Dorothe PeaJonathan F. Fouad Taul, LCSW, LCAS, CSI Clinical Social Worker Ph: [phone]

## 2017-06-30 NOTE — ED Notes (Signed)
Hourly rounding reveals patient sleeping in room. No complaints, stable, in no acute distress. Q15 minute rounds and monitoring via Security Cameras to continue. 

## 2017-06-30 NOTE — ED Notes (Signed)
Hourly rounding reveals patient in room. No complaints, stable, in no acute distress. Q15 minute rounds and monitoring via Security Cameras to continue.Snack and beverage given. 

## 2017-06-30 NOTE — ED Notes (Signed)
Pt A&O x 3, sleeping at present, no distress noted, calm & cooperative.  Monitoring for safety, Q 15 min checks in effect. 

## 2017-06-30 NOTE — [facility] Counselor (Signed)
[facility]
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
## 2017-06-30 NOTE — ED Notes (Signed)
Report to include situation, background, assessment and recommendations from Latrisha RN. Patient sleeping, respirations regular and unlabored. Q15 minute rounds and security camera observation to continue.   

## 2017-07-01 ENCOUNTER — Encounter (HOSPITAL_COMMUNITY): Payer: Self-pay | Admitting: *Deleted

## 2017-07-01 ENCOUNTER — Inpatient Hospital Stay (HOSPITAL_COMMUNITY)
Admission: AD | Admit: 2017-07-01 | Discharge: 2017-07-04 | DRG: 885 | Disposition: A | Discharge status: Home / Self Care | Payer: Federal, State, Local not specified - Other | Source: Intra-hospital | Attending: Psychiatry | Admitting: Psychiatry

## 2017-07-01 ENCOUNTER — Other Ambulatory Visit: Payer: Self-pay

## 2017-07-01 DIAGNOSIS — F17210 Nicotine dependence, cigarettes, uncomplicated: Secondary | ICD-10-CM | POA: Diagnosis present

## 2017-07-01 DIAGNOSIS — F1024 Alcohol dependence with alcohol-induced mood disorder: Secondary | ICD-10-CM

## 2017-07-01 DIAGNOSIS — F332 Major depressive disorder, recurrent severe without psychotic features: Principal | ICD-10-CM

## 2017-07-01 DIAGNOSIS — Z811 Family history of alcohol abuse and dependence: Secondary | ICD-10-CM

## 2017-07-01 DIAGNOSIS — Z91410 Personal history of adult physical and sexual abuse: Secondary | ICD-10-CM

## 2017-07-01 DIAGNOSIS — Z818 Family history of other mental and behavioral disorders: Secondary | ICD-10-CM

## 2017-07-01 DIAGNOSIS — F1414 Cocaine abuse with cocaine-induced mood disorder: Secondary | ICD-10-CM | POA: Diagnosis present

## 2017-07-01 DIAGNOSIS — F4000 Agoraphobia, unspecified: Secondary | ICD-10-CM | POA: Diagnosis present

## 2017-07-01 DIAGNOSIS — R45851 Suicidal ideations: Secondary | ICD-10-CM | POA: Diagnosis present

## 2017-07-01 DIAGNOSIS — F419 Anxiety disorder, unspecified: Secondary | ICD-10-CM | POA: Diagnosis present

## 2017-07-01 DIAGNOSIS — Y904 Blood alcohol level of 80-99 mg/100 ml: Secondary | ICD-10-CM | POA: Diagnosis present

## 2017-07-01 DIAGNOSIS — G4700 Insomnia, unspecified: Secondary | ICD-10-CM | POA: Diagnosis present

## 2017-07-01 DIAGNOSIS — Z62819 Personal history of unspecified abuse in childhood: Secondary | ICD-10-CM | POA: Diagnosis present

## 2017-07-01 DIAGNOSIS — F1010 Alcohol abuse, uncomplicated: Secondary | ICD-10-CM | POA: Diagnosis present

## 2017-07-01 DIAGNOSIS — Z590 Homelessness: Secondary | ICD-10-CM | POA: Diagnosis not present

## 2017-07-01 DIAGNOSIS — F1914 Other psychoactive substance abuse with psychoactive substance-induced mood disorder: Secondary | ICD-10-CM | POA: Diagnosis present

## 2017-07-01 LAB — LIPID PANEL
Cholesterol: 151 mg/dL (ref 0–200)
HDL: 68 mg/dL (ref >40)
LDL CALC: 71 mg/dL (ref 0–99)
TRIGLYCERIDES: 61 mg/dL (ref <150)
Total CHOL/HDL Ratio: 2.2 RATIO
VLDL: 12 mg/dL (ref 0–40)

## 2017-07-01 LAB — TSH: TSH: 0.901 u[IU]/mL (ref 0.350–4.500)

## 2017-07-01 LAB — HEMOGLOBIN A1C
HEMOGLOBIN A1C: 5.1 % (ref 4.8–5.6)
Mean Plasma Glucose: 99.67 mg/dL

## 2017-07-01 MED ORDER — ACETAMINOPHEN 325 MG PO TABS: 650.0000 mg | ORAL_TABLET | Freq: Four times a day (QID) | ORAL | Status: DC | PRN

## 2017-07-01 MED ORDER — TRAZODONE HCL 50 MG PO TABS
50.0000 mg | ORAL_TABLET | Freq: Every evening | ORAL | Status: DC | PRN
Start: 2017-07-01 — End: 2017-07-04
  Administered 2017-07-01 – 2017-07-03 (×2): 50 mg via ORAL
  Filled 2017-07-01 (×2): qty 1
  Filled 2017-07-01: qty 7
  Filled 2017-07-01: qty 1

## 2017-07-01 MED ORDER — IBUPROFEN 600 MG PO TABS
ORAL_TABLET | ORAL | Status: AC
  Filled 2017-07-01: qty 1

## 2017-07-01 MED ORDER — LOPERAMIDE HCL 2 MG PO CAPS: 2.0000 mg | ORAL_CAPSULE | ORAL | Status: DC | PRN

## 2017-07-01 MED ORDER — CHLORDIAZEPOXIDE HCL 25 MG PO CAPS
25.0000 mg | ORAL_CAPSULE | ORAL | Status: AC
  Administered 2017-07-03 – 2017-07-04 (×2): 25 mg via ORAL
  Filled 2017-07-01 (×2): qty 1

## 2017-07-01 MED ORDER — LOPERAMIDE HCL 2 MG PO CAPS: 2.0000 mg | ORAL_CAPSULE | ORAL | Status: AC | PRN

## 2017-07-01 MED ORDER — CHLORDIAZEPOXIDE HCL 25 MG PO CAPS
25.0000 mg | ORAL_CAPSULE | Freq: Three times a day (TID) | ORAL | Status: AC
  Administered 2017-07-02 – 2017-07-03 (×3): 25 mg via ORAL
  Filled 2017-07-01 (×3): qty 1

## 2017-07-01 MED ORDER — THIAMINE HCL 100 MG/ML IJ SOLN: 100.0000 mg | Freq: Once | INTRAMUSCULAR | Status: DC

## 2017-07-01 MED ORDER — MAGNESIUM HYDROXIDE 400 MG/5ML PO SUSP: 30.0000 mL | Freq: Every day | ORAL | Status: DC | PRN

## 2017-07-01 MED ORDER — CHLORDIAZEPOXIDE HCL 25 MG PO CAPS: 25.0000 mg | ORAL_CAPSULE | Freq: Every day | ORAL | Status: DC

## 2017-07-01 MED ORDER — IBUPROFEN 600 MG PO TABS
600.0000 mg | ORAL_TABLET | Freq: Four times a day (QID) | ORAL | Status: DC | PRN
  Administered 2017-07-01 – 2017-07-02 (×3): 600 mg via ORAL
  Filled 2017-07-01 (×2): qty 1

## 2017-07-01 MED ORDER — CHLORDIAZEPOXIDE HCL 25 MG PO CAPS
25.0000 mg | ORAL_CAPSULE | Freq: Four times a day (QID) | ORAL | Status: AC
  Administered 2017-07-01 – 2017-07-02 (×4): 25 mg via ORAL
  Filled 2017-07-01 (×4): qty 1

## 2017-07-01 MED ORDER — VITAMIN B-1 100 MG PO TABS
100.0000 mg | ORAL_TABLET | Freq: Every day | ORAL | Status: DC
  Administered 2017-07-02 – 2017-07-04 (×3): 100 mg via ORAL
  Filled 2017-07-01 (×5): qty 1

## 2017-07-01 MED ORDER — HYDROXYZINE HCL 25 MG PO TABS
ORAL_TABLET | ORAL | Status: AC
  Filled 2017-07-01: qty 1

## 2017-07-01 MED ORDER — ONDANSETRON 4 MG PO TBDP: 4.0000 mg | ORAL_TABLET | Freq: Four times a day (QID) | ORAL | Status: DC | PRN

## 2017-07-01 MED ORDER — LORAZEPAM 1 MG PO TABS: 1.0000 mg | ORAL_TABLET | Freq: Four times a day (QID) | ORAL | Status: DC | PRN

## 2017-07-01 MED ORDER — HYDROXYZINE HCL 25 MG PO TABS: 25.0000 mg | ORAL_TABLET | Freq: Four times a day (QID) | ORAL | Status: DC | PRN

## 2017-07-01 MED ORDER — ONDANSETRON 4 MG PO TBDP: 4.0000 mg | ORAL_TABLET | Freq: Four times a day (QID) | ORAL | Status: AC | PRN

## 2017-07-01 MED ORDER — HYDROXYZINE HCL 25 MG PO TABS: 25.0000 mg | ORAL_TABLET | Freq: Three times a day (TID) | ORAL | Status: DC | PRN

## 2017-07-01 MED ORDER — ALUM & MAG HYDROXIDE-SIMETH 200-200-20 MG/5ML PO SUSP: 30.0000 mL | ORAL | Status: DC | PRN

## 2017-07-01 MED ORDER — NICOTINE 21 MG/24HR TD PT24
21.0000 mg | MEDICATED_PATCH | Freq: Every day | TRANSDERMAL | Status: DC
  Administered 2017-07-01 – 2017-07-04 (×4): 21 mg via TRANSDERMAL
  Filled 2017-07-01 (×6): qty 1

## 2017-07-01 MED ORDER — CHLORDIAZEPOXIDE HCL 25 MG PO CAPS
25.0000 mg | ORAL_CAPSULE | Freq: Four times a day (QID) | ORAL | Status: AC | PRN
  Administered 2017-07-02: 25 mg via ORAL
  Filled 2017-07-01: qty 1

## 2017-07-01 MED ORDER — ARIPIPRAZOLE 5 MG PO TABS
5.0000 mg | ORAL_TABLET | Freq: Every day | ORAL | Status: DC
  Administered 2017-07-01 – 2017-07-04 (×4): 5 mg via ORAL
  Filled 2017-07-01 (×5): qty 1
  Filled 2017-07-01: qty 7
  Filled 2017-07-01: qty 1
  Filled 2017-07-01: qty 7

## 2017-07-01 MED ORDER — HYDROXYZINE HCL 25 MG PO TABS
25.0000 mg | ORAL_TABLET | Freq: Four times a day (QID) | ORAL | Status: DC | PRN
  Administered 2017-07-01: 25 mg via ORAL

## 2017-07-01 MED ORDER — ADULT MULTIVITAMIN W/MINERALS CH
1.0000 | ORAL_TABLET | Freq: Every day | ORAL | Status: DC
  Administered 2017-07-01 – 2017-07-04 (×4): 1 via ORAL
  Filled 2017-07-01 (×7): qty 1

## 2017-07-01 MED ORDER — ENSURE ENLIVE PO LIQD
237.0000 mL | Freq: Two times a day (BID) | ORAL | Status: DC
  Administered 2017-07-04: 237 mL via ORAL

## 2017-07-01 MED ORDER — SERTRALINE HCL 25 MG PO TABS
25.0000 mg | ORAL_TABLET | Freq: Every day | ORAL | Status: DC
  Administered 2017-07-01 – 2017-07-04 (×4): 25 mg via ORAL
  Filled 2017-07-01 (×2): qty 1
  Filled 2017-07-01: qty 7
  Filled 2017-07-01 (×2): qty 1
  Filled 2017-07-01: qty 7
  Filled 2017-07-01 (×2): qty 1
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
## 2017-07-01 NOTE — Progress Notes (Signed)
Recreation Therapy Notes  Date: 07/01/17 Time: 0930 Location: 500 Hall Dayroom  Group Topic: Stress Management  Goal Area(s) Addresses:  Patient will verbalize importance of using healthy stress management.  Patient will identify positive emotions associated with healthy stress management.   Intervention: Stress Management  Activity :  Meditation.  LRT introduced the stress management technique of meditation.  LRT played a meditation on forgiveness of others to allow patients to explore their feelings.  Patients were to follow along as the meditation played to fully engage in the technique.  Education:  Stress Management, Discharge Planning.   Education Outcome: Acknowledges edcuation/In group clarification offered/Needs additional education  Clinical Observations/Feedback:  Pt did not attendgroup.   Kristi RancherMarjette Elijha Perkins, LRT/CTRS         Kristi RancherLindsay, Magali Bray A 07/01/2017 11:42 AM
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
## 2017-07-01 NOTE — Progress Notes (Signed)
Nutrition Brief Note  Patient identified on the Malnutrition Screening Tool (MST) Report  Pt with weight loss since 2013. However, over the past 2 years patient's weight has remained stable. Pt has been ordered Ensure supplements.   Wt Readings from Last 15 Encounters:  07/01/17 93 lb (42.2 kg)  05/27/15 95 lb (43.1 kg)  05/27/15 95 lb (43.1 kg)  05/11/15 101 lb (45.8 kg)  05/25/12 131 lb (59.4 kg)  05/23/12 131 lb 8 oz (59.6 kg)  05/16/12 132 lb (59.9 kg)  05/09/12 130 lb (59 kg)  05/02/12 131 lb (59.4 kg)  04/25/12 126 lb (57.2 kg)  04/17/12 126 lb (57.2 kg)  04/04/12 120 lb (54.4 kg)  03/21/12 123 lb (55.8 kg)  03/07/12 121 lb (54.9 kg)  02/22/12 116 lb (52.6 kg)    Body mass index is 19.44 kg/m. Patient meets criteria for normal based on current BMI.   Current diet order is regular. Labs and medications reviewed.   No nutrition interventions warranted at this time. If nutrition issues arise, please consult RD.   Tilda FrancoLindsey Charlena Haub, MS, RD, LDN [facility] Clinical Dietitian Pager: [phone] After Hours Pager: [phone]
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
## 2017-07-01 NOTE — Progress Notes (Signed)
DAR NOTE: Patient presents with bright affect and pleasant mood. Pt has been visible in the milieu interacting with peers. Pt reports fair sleep, fair appetite, low energy and good concentration. Denies pain, auditory and visual hallucinations.  Rates depression at 8, hopelessness at 6, and anxiety at 10.  Maintained on routine safety checks.  Medications given as prescribed.  Support and encouragement offered as needed.  Attended group and participated.  States goal for today is " star back on all my medications."  Patient observed socializing with peers in the dayroom.  Offered no complaint.

## 2017-07-01 NOTE — BHH Group Notes (Signed)
LCSW Group Therapy Note   07/01/2017 1:15pm    Type of Therapy and Topic:  Group Therapy:  Overcoming Obstacles   Participation Level:  Active   Description of Group:    In this group patients will be encouraged to explore what they see as obstacles to their own wellness and recovery. They will be guided to discuss their thoughts, feelings, and behaviors related to these obstacles. The group will process together ways to cope with barriers, with attention given to specific choices patients can make. Each patient will be challenged to identify changes they are motivated to make in order to overcome their obstacles. This group will be process-oriented, with patients participating in exploration of their own experiences as well as giving and receiving support and challenge from other group members.   Therapeutic Goals: 1. Patient will identify personal and current obstacles as they relate to admission. 2. Patient will identify barriers that currently interfere with their wellness or overcoming obstacles.  3. Patient will identify feelings, thought process and behaviors related to these barriers. 4. Patient will identify two changes they are willing to make to overcome these obstacles:      Summary of Patient Progress    Identified missing her children as her major obstacle "I want to be able to see them again, I need to get back on my medications in order to do that."   Discussed triggers experienced [place] [facility] [place] [place] as major barrier to recovery "I will start to use again."  Also identified "boredom" as a trigger "its the devils playground" and identified need to remain busy and engaged w activities.    Therapeutic Modalities:   Cognitive Behavioral Therapy Solution Focused Therapy Motivational Interviewing Relapse Prevention Therapy   Sallee Langenne C Cunningham, LCSW 07/01/2017 1:20 PM
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
## 2017-07-02 ENCOUNTER — Other Ambulatory Visit: Payer: Self-pay

## 2017-07-02 DIAGNOSIS — R45851 Suicidal ideations: Secondary | ICD-10-CM

## 2017-07-02 DIAGNOSIS — Z91410 Personal history of adult physical and sexual abuse: Secondary | ICD-10-CM

## 2017-07-02 DIAGNOSIS — Z87891 Personal history of nicotine dependence: Secondary | ICD-10-CM

## 2017-07-02 DIAGNOSIS — F1010 Alcohol abuse, uncomplicated: Secondary | ICD-10-CM | POA: Diagnosis present

## 2017-07-02 DIAGNOSIS — F1910 Other psychoactive substance abuse, uncomplicated: Secondary | ICD-10-CM

## 2017-07-02 DIAGNOSIS — Z811 Family history of alcohol abuse and dependence: Secondary | ICD-10-CM

## 2017-07-02 DIAGNOSIS — Z818 Family history of other mental and behavioral disorders: Secondary | ICD-10-CM

## 2017-07-02 LAB — BASIC METABOLIC PANEL
Anion gap: 9 (ref 5–15)
BUN: 9 mg/dL (ref 6–20)
CHLORIDE: 101 mmol/L (ref 101–111)
CO2: 27 mmol/L (ref 22–32)
CREATININE: 1.05 mg/dL — AB (ref 0.44–1.00)
Calcium: 8.5 mg/dL — ABNORMAL LOW (ref 8.9–10.3)
GFR calc Af Amer: >60 mL/min (ref >60)
GFR calc non Af Amer: >60 mL/min (ref >60)
GLUCOSE: 95 mg/dL (ref 65–99)
Potassium: 3.8 mmol/L (ref 3.5–5.1)
Sodium: 137 mmol/L (ref 135–145)

## 2017-07-02 MED ORDER — QUETIAPINE FUMARATE 25 MG PO TABS
25.0000 mg | ORAL_TABLET | Freq: Two times a day (BID) | ORAL | Status: DC | PRN
  Administered 2017-07-02 – 2017-07-03 (×2): 25 mg via ORAL
  Filled 2017-07-02: qty 10
  Filled 2017-07-02 (×2): qty 1

## 2017-07-02 MED ORDER — HYDROXYZINE HCL 50 MG PO TABS
50.0000 mg | ORAL_TABLET | Freq: Three times a day (TID) | ORAL | Status: DC | PRN
  Administered 2017-07-02 – 2017-07-03 (×2): 50 mg via ORAL
  Filled 2017-07-02: qty 1
  Filled 2017-07-02: qty 10
  Filled 2017-07-02: qty 1

## 2017-07-02 NOTE — Progress Notes (Signed)
Recreation Therapy Notes  Animal-Assisted Activity (AAA) Program Checklist/Progress Notes Patient Eligibility Criteria Checklist & Daily Group note for Rec TxIntervention  Date: 11.06.2018 Time: 2:45pm Location: [address] Dayroom   AAA/T Program Assumption of Risk Form signed by Patient/ or Parent Legal Guardian Yes  Patient is free of allergies or sever asthma Yes  Patient reports no fear of animals Yes  Patient reports no history of cruelty to animals Yes  Patient understands his/her participation is voluntary Yes  Patient washes hands before animal contact Yes  Patient washes hands after animal contact Yes  Behavioral Response: Appropriate   Education:Hand Washing, Appropriate Animal Interaction   Education Outcome: Acknowledges education.   Clinical Observations/Feedback: Patient attended session and interacted appropriately with therapy dog and peers.   Kristi Perkins, LRT/CTRS        Kristi Perkins L 07/02/2017 3:00 PM 
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
## 2017-07-02 NOTE — Progress Notes (Signed)
[facility] MD Progress Note  07/02/2017 3:08 PM Kristi Perkins  MRN:  [id number]   Subjective:  Patient reports that she needs her Xanax back for her anxiety, even though she is taking Librium for detox. She reprots that she feels agitated and this is a common problem for her. She denies any SI/HI/AVH and contracts for safety.  Objective: Patient's chart and findings reviewed and discussed with treatment team. Patinet is cooperative and appears agitated. Decision made to start Seroquel 25 mg PO BID PRN for agitation and increase her Vistaril to 50 mg PO TID PRN for anxiety. The patient seemed fixated on getting her Xanax back and she was educated on detox from alcohol and benzodiazipines being the same treatment.  Principal Problem: Severe recurrent major depression without psychotic features ([place]) Diagnosis:   Patient Active Problem List   Diagnosis Date Noted  . ETOH abuse [F10.10] 07/02/2017  . Severe recurrent major depression without psychotic features ([place]) [F33.2] 07/01/2017  . Major depressive disorder, recurrent severe without psychotic features ([place]) [F33.2] 06/30/2017  . Vaginal delivery [O80] 05/30/2015  . Marijuana use (noted on UDS on 05/28/15) [F12.90] 05/30/2015  . 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 Rubella non-immune status, antepartum [O99.89, Z28.3] 05/27/2015  . IUGR (intrauterine growth restriction)--EFW 1732 gm, 3+9, < 10%ile. [XKG8185] 05/27/2015  . Anemia--8.6 on 05/11/15. [D64.9] 05/27/2015  . Group beta Strep positive [B95.1] 05/10/2015  . H/O drug abuse in past [Z87.898] 05/06/2015  . Body mass index (BMI) of 20.0-20.9 in adult [Z68.20] 05/06/2015  . Hernia [K46.9] 05/06/2015  . Short stature [R62.52] 05/04/2012  . Depression [F32.9] 12/26/2011  . Anxiety [F41.9] 12/26/2011   Total Time spent with patient: 25 minutes  Past Psychiatric History: See H&P  Past Medical History:  Past Medical History:  Diagnosis Date  .  Anxiety    on xanax at beginning of preg   . Depression    bipolar  . FH: anemia    Father  . FH: cancer   . FH: diabetes mellitus   . FH: heart disease   . FH: kidney disease   . FH: liver disease   . FH: migraines   . FH: Parkinson's disease    Father  . H/O cocaine abuse 2006    had rehab   . H/O varicella   . Hernia   . Hx of ovarian cyst   . Low iron   . UTI (urinary tract infection)     x1   History reviewed. No pertinent surgical history. Family History:  Family History  Problem Relation Age of Onset  . Heart disease Father   . Hypertension Father   . Diabetes Father   . Kidney disease Father   . Depression Father   . Alcohol abuse Father   . Cancer Maternal Grandmother   . Kidney disease Paternal Grandfather    Family Psychiatric  History: See h&P Social History:  Social History   Substance and Sexual Activity  Alcohol Use Yes   Comment: drinks 4 or more drinks daily     Social History   Substance and Sexual Activity  Drug Use Yes  . Types: Cocaine, Marijuana    Social History   Socioeconomic History  . Marital status: Married    Spouse name: None  . Number of children: None  . Years of education: None  . Highest education level: None  Social Needs  . Financial resource strain: None  . Food insecurity - worry: None  .  Food insecurity - inability: None  . Transportation needs - medical: None  . Transportation needs - non-medical: None  Occupational History  . None  Tobacco Use  . Smoking status: Current Every Day Smoker    Packs/day: 1.00  . Smokeless tobacco: Never Used  Substance and Sexual Activity  . Alcohol use: Yes    Comment: drinks 4 or more drinks daily  . Drug use: Yes    Types: Cocaine, Marijuana  . Sexual activity: Yes    Birth control/protection: Condom  Other Topics Concern  . None  Social History Narrative  . None   Additional Social History:    Pain Medications: denies Prescriptions: denies Over the Counter:  denies History of alcohol / drug use?: Yes Longest period of sobriety (when/how long): "5 years"  Negative Consequences of Use: Financial, Personal relationships Withdrawal Symptoms: Other (Comment)(anxiety) Name of Substance 1: alcohol 1 - Age of First Use: 22 1 - Amount (size/oz): 4 or more drinks daily 1 - Frequency: daily 1 - Duration: on-going 1 - Last Use / Amount: 06/28/17 3 40 0z beers Name of Substance 2: Cocaine  2 - Age of First Use: 17 2 - Amount (size/oz): "20 dollars worth or more"  2 - Frequency: daily 2 - Duration: on-going 2 - Last Use / Amount: 06/28/17 1/4 gram Name of Substance 3: marijuana 3 - Age of First Use: 14 3 - Amount (size/oz): "a blunt" 3 - Frequency: daily 3 - Duration: on-going 3 - Last Use / Amount: 06/28/17 1 gram              Sleep: Good  Appetite:  Good  Current Medications: Current Facility-Administered Medications  Medication Dose Route Frequency Provider Last Rate Last Dose  . alum & mag hydroxide-simeth (MAALOX/MYLANTA) 200-200-20 MG/5ML suspension 30 mL  30 mL Oral Q4H PRN Lindon Romp A, NP      . ARIPiprazole (ABILIFY) tablet 5 mg  5 mg Oral Daily Cobos, Myer Peer, MD   5 mg at 07/02/17 0826  . chlordiazePOXIDE (LIBRIUM) capsule 25 mg  25 mg Oral Q6H PRN Cobos, Myer Peer, MD   25 mg at 07/02/17 0644  . chlordiazePOXIDE (LIBRIUM) capsule 25 mg  25 mg Oral TID Cobos, Myer Peer, MD   25 mg at 07/02/17 1151   Followed by  . [START ON 07/03/2017] chlordiazePOXIDE (LIBRIUM) capsule 25 mg  25 mg Oral BH-qamhs Cobos, Myer Peer, MD       Followed by  . [START ON 07/05/2017] chlordiazePOXIDE (LIBRIUM) capsule 25 mg  25 mg Oral Daily Cobos, Fernando A, MD      . feeding supplement (ENSURE ENLIVE) (ENSURE ENLIVE) liquid 237 mL  237 mL Oral BID BM Cobos, Fernando A, MD      . hydrOXYzine (ATARAX/VISTARIL) tablet 50 mg  50 mg Oral TID PRN Estrella Alcaraz, Lowry Ram, FNP   50 mg at 07/02/17 1440  . ibuprofen (ADVIL,MOTRIN) tablet 600 mg  600 mg Oral Q6H  PRN Lindon Romp A, NP   600 mg at 07/02/17 0830  . loperamide (IMODIUM) capsule 2-4 mg  2-4 mg Oral PRN Cobos, Myer Peer, MD      . magnesium hydroxide (MILK OF MAGNESIA) suspension 30 mL  30 mL Oral Daily PRN Lindon Romp A, NP      . multivitamin with minerals tablet 1 tablet  1 tablet Oral Daily Cobos, Myer Peer, MD   1 tablet at 07/02/17 0826  . nicotine (NICODERM CQ - dosed in mg/24 hours)  patch 21 mg  21 mg Transdermal Daily Cobos, Myer Peer, MD   21 mg at 07/02/17 0829  . ondansetron (ZOFRAN-ODT) disintegrating tablet 4 mg  4 mg Oral Q6H PRN Cobos, Myer Peer, MD      . QUEtiapine (SEROQUEL) tablet 25 mg  25 mg Oral BID PRN Orine Goga, Lowry Ram, FNP   25 mg at 07/02/17 1440  . sertraline (ZOLOFT) tablet 25 mg  25 mg Oral Daily Cobos, Myer Peer, MD   25 mg at 07/02/17 0826  . thiamine (VITAMIN B-1) tablet 100 mg  100 mg Oral Daily Cobos, Myer Peer, MD   100 mg at 07/02/17 0826  . traZODone (DESYREL) tablet 50 mg  50 mg Oral QHS PRN Rozetta Nunnery, NP   50 mg at 07/01/17 2208    Lab Results:  Results for orders placed or performed during the hospital encounter of 07/01/17 (from the past 48 hour(s))  Hemoglobin A1c     Status: None   Collection Time: 07/01/17  7:06 AM  Result Value Ref Range   Hgb A1c MFr Bld 5.1 4.8 - 5.6 %    Comment: (NOTE) Pre diabetes:          5.7%-6.4% Diabetes:              >6.4% Glycemic control for   <7.0% adults with diabetes    Mean Plasma Glucose 99.67 mg/dL    Comment: Performed at [facility], [place] [address] [place], [place] [address]  Lipid panel     Status: None   Collection Time: 07/01/17  7:06 AM  Result Value Ref Range   Cholesterol 151 0 - 200 mg/dL   Triglycerides 61 <150 mg/dL   HDL 68 >40 mg/dL   Total CHOL/HDL Ratio 2.2 RATIO   VLDL 12 0 - 40 mg/dL   LDL Cholesterol 71 0 - 99 mg/dL    Comment:        Total Cholesterol/HDL:CHD Risk Coronary Heart Disease Risk Table                     Men   Women  1/2 Average Risk   3.4    3.3  Average Risk       5.0   4.4  2 X Average Risk   9.6   7.1  3 X Average Risk  23.4   11.0        Use the calculated Patient Ratio above and the CHD Risk Table to determine the patient's CHD Risk.        ATP III CLASSIFICATION (LDL):  <100     mg/dL   Optimal  100-129  mg/dL   Near or Above                    Optimal  130-159  mg/dL   Borderline  160-189  mg/dL   High  >190     mg/dL   Very High Performed at [facility] [address] [place], [place] [address]   TSH     Status: None   Collection Time: 07/01/17  7:06 AM  Result Value Ref Range   TSH 0.901 0.350 - 4.500 uIU/mL    Comment: Performed by a 3rd Generation assay with a functional sensitivity of <=0.01 uIU/mL. Performed at [facility], [place] [address] [place], [place] [address]   Basic metabolic panel     Status: Abnormal   Collection Time: 07/02/17  6:40 AM  Result Value Ref Range   Sodium 137 135 - 145 mmol/L   Potassium 3.8 3.5 - 5.1 mmol/L   Chloride 101 101 - 111 mmol/L   CO2 27 22 - 32 mmol/L   Glucose, Bld 95 65 - 99 mg/dL   BUN 9 6 - 20 mg/dL   Creatinine, Ser 1.05 (H) 0.44 - 1.00 mg/dL   Calcium 8.5 (L) 8.9 - 10.3 mg/dL   GFR calc non Af Amer >60 >60 mL/min   GFR calc Af Amer >60 >60 mL/min    Comment: (NOTE) The eGFR has been calculated using the CKD EPI equation. This calculation has not been validated in all clinical situations. eGFR's persistently <60 mL/min signify possible Chronic Kidney Disease.    Anion gap 9 5 - 15    Comment: Performed at [facility], [place] [address] [place] [address] [place], [place] [address]    Blood Alcohol level:  Lab Results  Component Value Date   ETH 82 (H) 83/38/2505    Metabolic Disorder Labs: Lab Results  Component Value Date   HGBA1C 5.1 07/01/2017   MPG 99.67 07/01/2017   No results found for: PROLACTIN Lab Results  Component Value Date   CHOL 151 07/01/2017   TRIG 61 07/01/2017   HDL 68  07/01/2017   CHOLHDL 2.2 07/01/2017   VLDL 12 07/01/2017   LDLCALC 71 07/01/2017    Physical Findings: AIMS: Facial and Oral Movements Muscles of Facial Expression: None, normal Lips and Perioral Area: None, normal Jaw: None, normal Tongue: None, normal,Extremity Movements Upper (arms, wrists, hands, fingers): None, normal Lower (legs, knees, ankles, toes): None, normal, Trunk Movements Neck, shoulders, hips: None, normal, Overall Severity Severity of abnormal movements (highest score from questions above): None, normal Incapacitation due to abnormal movements: None, normal Patient's awareness of abnormal movements (rate only patient's report): No Awareness, Dental Status Current problems with teeth and/or dentures?: No Does patient usually wear dentures?: No  CIWA:  CIWA-Ar Total: 6 COWS:  COWS Total Score: 3  Musculoskeletal: Strength & Muscle Tone: within normal limits Gait & Station: normal Patient leans: N/A  Psychiatric Specialty Exam: Physical Exam  Nursing note and vitals reviewed. Constitutional: She is oriented to person, place, and time. She appears well-developed and well-nourished.  Cardiovascular: Normal rate.  Respiratory: Effort normal.  Musculoskeletal: Normal range of motion.  Neurological: She is alert and oriented to person, place, and time.  Skin: Skin is warm.    Review of Systems  Constitutional: Negative.   HENT: Negative.   Eyes: Negative.   Respiratory: Negative.   Cardiovascular: Negative.   Gastrointestinal: Negative.   Genitourinary: Negative.   Musculoskeletal: Negative.   Skin: Negative.   Neurological: Negative.   Endo/Heme/Allergies: Negative.   Psychiatric/Behavioral: Positive for depression. Negative for hallucinations and suicidal ideas. The patient is nervous/anxious.     Blood pressure (!) 122/91, pulse 81, temperature 98.3 F (36.8 C), temperature source Oral, resp. rate 16, height _0  (1.473 m), weight 42.2 kg (93 lb),  last menstrual period 07/01/2017, not currently breastfeeding.Body mass index is 19.44 kg/m.  General Appearance: Casual  Eye Contact:  Good  Speech:  Clear and Coherent and Normal Rate  Volume:  Normal  Mood:  Anxious and Irritable  Affect:  Congruent  Thought Process:  Goal Directed and Descriptions of Associations: Intact  Orientation:  Full (Time, Place, and Person)  Thought Content:  WDL  Suicidal Thoughts:  No  Homicidal Thoughts:  No  Memory:  Immediate;  Good Recent;   Good Remote;   Good  Judgement:  Good  Insight:  Good  Psychomotor Activity:  Normal  Concentration:  Concentration: Good and Attention Span: Good  Recall:  Good  Fund of Knowledge:  Good  Language:  Good  Akathisia:  No  Handed:  Right  AIMS (if indicated):     Assets:  Communication Skills Desire for Improvement Financial Resources/Insurance Housing Physical Health Social Support Transportation  ADL's:  Intact  Cognition:  WNL  Sleep:  Number of Hours: 6.5   Problems Addressed: Anxiety MDD severe ETOH abuse  Treatment Plan Summary: Daily contact with patient to assess and evaluate symptoms and progress in treatment, Medication management and Plan is to:  -Start Seroquel 25 mg PO BID PRN for agitation -Increase Vistaril 50 mg PO TID PRN for anxiety -Continue Abilify 5 mg PO Daily for mood stability -Continue Librium Detox Protocol -Continue Zoloft 25 mg PO Daily for mood stability -Continue Trazodone 50 mg PO QHS PRN for insomnia -Encourage group therapy participation  Lewis Shock, FNP 07/02/2017, 3:08 PM

## 2017-07-02 NOTE — Progress Notes (Signed)
Patient ID: Kristi Perkins, female   DOB: Mar 19, 1986, 31 y.o.   MRN: [id number]  D: Went to room around 8pm to assess patient. She was in bed sleeping. Called her name x 3 but patient sleeping soundly at this time. Will assess later when awake.  A: Staff will monitor on q 15 minute checks, follow treatment plan, and give meds as ordered. R: Continues to be sleeping when checking on several times tonight.

## 2017-07-02 NOTE — Social Work (Signed)
Referrals made: [facility]:  Shayla received but has not reviewed referral yet, will review this evening and inform [facility] or [place] tomorrow of any possible bed.   [facility]:  Per June, they cannot schedule [place]. No openings this week, very limited next week. Unlikely [facility] will be an option for patient.   Santa GeneraAnne Odessia Asleson, LCSW Lead Clinical Social Worker Phone:  [phone]

## 2017-07-02 NOTE — Plan of Care (Signed)
Patient is attending groups and participating in her treatment.

## 2017-07-02 NOTE — [facility] Group Notes (Signed)
[facility] 07/02/2017 1:15pm  Type of Therapy: Mental Health Association Presentation  Participation Level: Active  Participation Quality: Attentive  Affect: Appropriate  Cognitive: Oriented  Insight: Developing/Improving  Engagement in Therapy: Engaged  Modes of Intervention: Discussion, Education and Socialization  Summary of Progress/Problems: Mental Health Association (MHA) Speaker came to talk about his personal journey with living with a mental health diagnosis. The pt processed ways by which to relate to the speaker. MHA speaker provided handouts and educational information pertaining to groups and services offered by the [facility]. Pt was engaged in speaker's presentation and was receptive to resources provided.    Kristi JordanLynn B Niang Perkins, MSW, LCSWA 07/02/2017 4:17 PM

## 2017-07-03 NOTE — Progress Notes (Signed)
Recreation Therapy Notes  Date: 07/03/17 Time: 0930 Location: 300 [address] Dayroom  Group Topic: Stress Management  Goal Area(s) Addresses:  Patient will verbalize importance of using healthy stress management.  Patient will identify positive emotions associated with healthy stress management.   Behavioral Response:  Engaged  Intervention: Stress Management  Activity :  Guided Imagery.  LRT introduced the stress management technique of guided imagery.  LRT read a script to allow patients to take a mental vacation to escape their daily routine for a few minutes.  Education:  Stress Management, Discharge Planning.   Education Outcome: Acknowledges edcuation/In group clarification offered/Needs additional education  Clinical Observations/Feedback: Pt attended group.    Caroll RancherMarjette Eastin Swing, LRT/CTRS         Caroll RancherLindsay, Waynetta Metheny A 07/03/2017 11:49 AM

## 2017-07-03 NOTE — Progress Notes (Addendum)
D: Patient was in hallway.  She states she feels "better." Pt goal, "[place]. My main problem is substance abuse that what results to suicide thoughts.  Pt denies SI/HI, denies hallucinations, denies pain. She attended evening groups.    A: Introduced self to pt. Provided support and encouragement. Medication administered per order. Q15 minute safety checks maintained.  R: Pt is safe on the unit.  Pt is compliant with medications.  Pt verbally contracts for safety.  Will continue to monitor, maintain safety, and assess.

## 2017-07-03 NOTE — Progress Notes (Signed)
D: Patient is focused on discharge today.  She states she feels "better."  She did request vistaril and seroquel with her librium and she was informed that it would be too sedating and she agreed to wait.  She denies any thoughts of self harm.  Her affect is a little brighter today; she is pleasant with staff.  She is visible in the milieu and is interacting well with peers.  She has been attending group and appears engaged and vested in her treatment.  She denies any physical pain today. A: Continue to monitor medication management and MD orders.  Safety checks completed every 15 minutes per protocol.  Offer support and encouragement as needed. R: Patient is receptive to staff; her behavior is appropriate.

## 2017-07-03 NOTE — Plan of Care (Signed)
Patient is focused on discharge.  She denies any thoughts of self harm.  She has been attending groups and participating.

## 2017-07-03 NOTE — Progress Notes (Signed)
[facility] MD Progress Note  07/03/2017 12:48 PM Kristi Perkins  MRN:  [id number]   Subjective:  Patient reports "I slept like 12 hours." She denies any SI/HI/AVh and depression is rated at 2/10. She feels it was due to the sleep and the medications. She does not feel agitated and is ready to plan for discharge.   Objective: Patient's chart and findings reviewed and discussed with treatment team. Patient presents with bright affect and improved mood today. After approximately 12 hours of sleep patient realizes she was appearing manic and would like to continue current medications. Patient will have Librium protocol stopped after the evening dose to ensure no withdrawal symptoms for potential discharge tomorrow. Patient's thought process is clearer today and not focused on medications.  Principal Problem: Severe recurrent major depression without psychotic features ([place]) Diagnosis:   Patient Active Problem List   Diagnosis Date Noted  . ETOH abuse [F10.10] 07/02/2017  . Severe recurrent major depression without psychotic features ([place]) [F33.2] 07/01/2017  . Major depressive disorder, recurrent severe without psychotic features ([place]) [F33.2] 06/30/2017  . Vaginal delivery [O80] 05/30/2015  . Marijuana use (noted on UDS on 05/28/15) [F12.90] 05/30/2015  . 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 Rubella non-immune status, antepartum [O99.89, Z28.3] 05/27/2015  . IUGR (intrauterine growth restriction)--EFW 1732 gm, 3+9, < 10%ile. [GEX5284] 05/27/2015  . Anemia--8.6 on 05/11/15. [D64.9] 05/27/2015  . Group beta Strep positive [B95.1] 05/10/2015  . H/O drug abuse in past [Z87.898] 05/06/2015  . Body mass index (BMI) of 20.0-20.9 in adult [Z68.20] 05/06/2015  . Hernia [K46.9] 05/06/2015  . Short stature [R62.52] 05/04/2012  . Depression [F32.9] 12/26/2011  . Anxiety [F41.9] 12/26/2011   Total Time spent with patient: 25 minutes  Past Psychiatric History: See  H&P  Past Medical History:  Past Medical History:  Diagnosis Date  . Anxiety    on xanax at beginning of preg   . Depression    bipolar  . FH: anemia    Father  . FH: cancer   . FH: diabetes mellitus   . FH: heart disease   . FH: kidney disease   . FH: liver disease   . FH: migraines   . FH: Parkinson's disease    Father  . H/O cocaine abuse 2006    had rehab   . H/O varicella   . Hernia   . Hx of ovarian cyst   . Low iron   . UTI (urinary tract infection)     x1   History reviewed. No pertinent surgical history. Family History:  Family History  Problem Relation Age of Onset  . Heart disease Father   . Hypertension Father   . Diabetes Father   . Kidney disease Father   . Depression Father   . Alcohol abuse Father   . Cancer Maternal Grandmother   . Kidney disease Paternal Grandfather    Family Psychiatric  History: See H&P Social History:  Social History   Substance and Sexual Activity  Alcohol Use Yes   Comment: drinks 4 or more drinks daily     Social History   Substance and Sexual Activity  Drug Use Yes  . Types: Cocaine, Marijuana    Social History   Socioeconomic History  . Marital status: Married    Spouse name: None  . Number of children: None  . Years of education: None  . Highest education level: None  Social Needs  . Financial resource strain: None  .  Food insecurity - worry: None  . Food insecurity - inability: None  . Transportation needs - medical: None  . Transportation needs - non-medical: None  Occupational History  . None  Tobacco Use  . Smoking status: Current Every Day Smoker    Packs/day: 1.00  . Smokeless tobacco: Never Used  Substance and Sexual Activity  . Alcohol use: Yes    Comment: drinks 4 or more drinks daily  . Drug use: Yes    Types: Cocaine, Marijuana  . Sexual activity: Yes    Birth control/protection: Condom  Other Topics Concern  . None  Social History Narrative  . None   Additional Social History:     Pain Medications: denies Prescriptions: denies Over the Counter: denies History of alcohol / drug use?: Yes Longest period of sobriety (when/how long): "5 years"  Negative Consequences of Use: Financial, Personal relationships Withdrawal Symptoms: Other (Comment)(anxiety) Name of Substance 1: alcohol 1 - Age of First Use: 22 1 - Amount (size/oz): 4 or more drinks daily 1 - Frequency: daily 1 - Duration: on-going 1 - Last Use / Amount: 06/28/17 3 40 0z beers Name of Substance 2: Cocaine  2 - Age of First Use: 17 2 - Amount (size/oz): "20 dollars worth or more"  2 - Frequency: daily 2 - Duration: on-going 2 - Last Use / Amount: 06/28/17 1/4 gram Name of Substance 3: marijuana 3 - Age of First Use: 14 3 - Amount (size/oz): "a blunt" 3 - Frequency: daily 3 - Duration: on-going 3 - Last Use / Amount: 06/28/17 1 gram              Sleep: Good  Appetite:  Good  Current Medications: Current Facility-Administered Medications  Medication Dose Route Frequency Provider Last Rate Last Dose  . alum & mag hydroxide-simeth (MAALOX/MYLANTA) 200-200-20 MG/5ML suspension 30 mL  30 mL Oral Q4H PRN Lindon Romp A, NP      . ARIPiprazole (ABILIFY) tablet 5 mg  5 mg Oral Daily Cobos, Myer Peer, MD   5 mg at 07/03/17 0814  . chlordiazePOXIDE (LIBRIUM) capsule 25 mg  25 mg Oral Q6H PRN Cobos, Myer Peer, MD   25 mg at 07/02/17 0644  . chlordiazePOXIDE (LIBRIUM) capsule 25 mg  25 mg Oral BH-qamhs Cobos, Fernando A, MD      . feeding supplement (ENSURE ENLIVE) (ENSURE ENLIVE) liquid 237 mL  237 mL Oral BID BM Cobos, Fernando A, MD      . hydrOXYzine (ATARAX/VISTARIL) tablet 50 mg  50 mg Oral TID PRN , Lowry Ram, FNP   50 mg at 07/02/17 1440  . ibuprofen (ADVIL,MOTRIN) tablet 600 mg  600 mg Oral Q6H PRN Lindon Romp A, NP   600 mg at 07/02/17 0830  . loperamide (IMODIUM) capsule 2-4 mg  2-4 mg Oral PRN Cobos, Myer Peer, MD      . magnesium hydroxide (MILK OF MAGNESIA) suspension 30 mL  30  mL Oral Daily PRN Lindon Romp A, NP      . multivitamin with minerals tablet 1 tablet  1 tablet Oral Daily Cobos, Myer Peer, MD   1 tablet at 07/03/17 0814  . nicotine (NICODERM CQ - dosed in mg/24 hours) patch 21 mg  21 mg Transdermal Daily Cobos, Myer Peer, MD   21 mg at 07/03/17 0816  . ondansetron (ZOFRAN-ODT) disintegrating tablet 4 mg  4 mg Oral Q6H PRN Cobos, Fernando A, MD      . QUEtiapine (SEROQUEL) tablet 25 mg  25  mg Oral BID PRN , Lowry Ram, FNP   25 mg at 07/02/17 1440  . sertraline (ZOLOFT) tablet 25 mg  25 mg Oral Daily Cobos, Myer Peer, MD   25 mg at 07/03/17 0813  . thiamine (VITAMIN B-1) tablet 100 mg  100 mg Oral Daily Cobos, Myer Peer, MD   100 mg at 07/03/17 0813  . traZODone (DESYREL) tablet 50 mg  50 mg Oral QHS PRN Rozetta Nunnery, NP   50 mg at 07/01/17 2208    Lab Results:  Results for orders placed or performed during the hospital encounter of 07/01/17 (from the past 48 hour(s))  Basic metabolic panel     Status: Abnormal   Collection Time: 07/02/17  6:40 AM  Result Value Ref Range   Sodium 137 135 - 145 mmol/L   Potassium 3.8 3.5 - 5.1 mmol/L   Chloride 101 101 - 111 mmol/L   CO2 27 22 - 32 mmol/L   Glucose, Bld 95 65 - 99 mg/dL   BUN 9 6 - 20 mg/dL   Creatinine, Ser 1.05 (H) 0.44 - 1.00 mg/dL   Calcium 8.5 (L) 8.9 - 10.3 mg/dL   GFR calc non Af Amer >60 >60 mL/min   GFR calc Af Amer >60 >60 mL/min    Comment: (NOTE) The eGFR has been calculated using the CKD EPI equation. This calculation has not been validated in all clinical situations. eGFR's persistently <60 mL/min signify possible Chronic Kidney Disease.    Anion gap 9 5 - 15    Comment: Performed at [facility], [place] [address] [place], [place] [address]    Blood Alcohol level:  Lab Results  Component Value Date   ETH 82 (H) 38/45/3646    Metabolic Disorder Labs: Lab Results  Component Value Date   HGBA1C 5.1 07/01/2017   MPG 99.67 07/01/2017   No results  found for: PROLACTIN Lab Results  Component Value Date   CHOL 151 07/01/2017   TRIG 61 07/01/2017   HDL 68 07/01/2017   CHOLHDL 2.2 07/01/2017   VLDL 12 07/01/2017   LDLCALC 71 07/01/2017    Physical Findings: AIMS: Facial and Oral Movements Muscles of Facial Expression: None, normal Lips and Perioral Area: None, normal Jaw: None, normal Tongue: None, normal,Extremity Movements Upper (arms, wrists, hands, fingers): None, normal Lower (legs, knees, ankles, toes): None, normal, Trunk Movements Neck, shoulders, hips: None, normal, Overall Severity Severity of abnormal movements (highest score from questions above): None, normal Incapacitation due to abnormal movements: None, normal Patient's awareness of abnormal movements (rate only patient's report): No Awareness, Dental Status Current problems with teeth and/or dentures?: No Does patient usually wear dentures?: No  CIWA:  CIWA-Ar Total: 1 COWS:  COWS Total Score: 3  Musculoskeletal: Strength & Muscle Tone: within normal limits Gait & Station: normal Patient leans: N/A  Psychiatric Specialty Exam: Physical Exam  Nursing note and vitals reviewed. Constitutional: She is oriented to person, place, and time. She appears well-developed and well-nourished.  Cardiovascular: Normal rate.  Respiratory: Effort normal.  Musculoskeletal: Normal range of motion.  Neurological: She is alert and oriented to person, place, and time.  Skin: Skin is warm.    Review of Systems  Constitutional: Negative.   HENT: Negative.   Eyes: Negative.   Respiratory: Negative.   Cardiovascular: Negative.   Gastrointestinal: Negative.   Genitourinary: Negative.   Musculoskeletal: Negative.   Skin: Negative.   Neurological: Negative.   Endo/Heme/Allergies: Negative.   Psychiatric/Behavioral: Negative.  Blood pressure (P) 113/77, pulse (P) 89, temperature 98.3 F (36.8 C), temperature source Oral, resp. rate 16, height 4' 10" (1.473 m),  weight 42.2 kg (93 lb), last menstrual period 07/01/2017, not currently breastfeeding.Body mass index is 19.44 kg/m.  General Appearance: Casual  Eye Contact:  Good  Speech:  Clear and Coherent and Normal Rate  Volume:  Normal  Mood:  Euthymic  Affect:  Appropriate  Thought Process:  Goal Directed and Descriptions of Associations: Intact  Orientation:  Full (Time, Place, and Person)  Thought Content:  WDL  Suicidal Thoughts:  No  Homicidal Thoughts:  No  Memory:  Immediate;   Good Recent;   Good Remote;   Good  Judgement:  Good  Insight:  Good  Psychomotor Activity:  Normal  Concentration:  Concentration: Good and Attention Span: Good  Recall:  Good  Fund of Knowledge:  Good  Language:  Good  Akathisia:  No  Handed:  Right  AIMS (if indicated):     Assets:  Communication Skills Desire for Improvement Financial Resources/Insurance Housing Physical Health Social Support Transportation  ADL's:  Intact  Cognition:  WNL  Sleep:  Number of Hours: 6.5     Treatment Plan Summary: Daily contact with patient to assess and evaluate symptoms and progress in treatment, Medication management and Plan is to:  -Discontinue Librium protocol after today's last dose -Continue Abilify 5 mg PO Daily for mood stability -Continue Seroquel 25 mg PO BID PRN for agitation -Continue Vistaril 50 mg PO TID PRN for anxiety -Continue Zoloft 25 mg PO Daily for mood stability -Encourage group therapy participation  Lewis Shock, FNP 07/03/2017, 12:48 PM

## 2017-07-04 MED ORDER — SERTRALINE HCL 25 MG PO TABS: 25.0000 mg | ORAL_TABLET | Freq: Every day | ORAL | 0 refills | Status: AC

## 2017-07-04 MED ORDER — TRAZODONE HCL 50 MG PO TABS: 50.0000 mg | ORAL_TABLET | Freq: Every evening | ORAL | 0 refills | Status: AC | PRN

## 2017-07-04 MED ORDER — HYDROXYZINE HCL 50 MG PO TABS: 50.0000 mg | ORAL_TABLET | Freq: Three times a day (TID) | ORAL | 0 refills | Status: AC | PRN

## 2017-07-04 MED ORDER — ARIPIPRAZOLE 5 MG PO TABS: 5.0000 mg | ORAL_TABLET | Freq: Every day | ORAL | 0 refills | Status: AC

## 2017-07-04 MED ORDER — QUETIAPINE FUMARATE 25 MG PO TABS: 25.0000 mg | ORAL_TABLET | Freq: Two times a day (BID) | ORAL | 0 refills | Status: AC | PRN

## 2017-07-04 NOTE — Progress Notes (Signed)
  [facility] :  Will you be returning to the same living situation after discharge:  Yes,  pt returning home At discharge, do you have transportation home?: Yes,  pt has access to transportation. Do you have the ability to pay for your medications: Yes,  presciptions and samples provided.  Release of information consent forms completed and in the chart;  Patient's signature needed at discharge.  Patient to Follow up at: Follow-up Information    [facility] Follow up.   Specialty:  Addiction Medicine Why:  A referral has been made on your behalf. Please continue to call Shayla and check for bed availability.  Contact information: [address] [place] [address] [phone]        [facility] Follow up.   Why:  A referral has been made on your behalf. Please continue to call and check for bed availability.  Contact information: [place] [place] [address] [phone]        [facility] Follow [facility].   Specialty:  Behavioral Health Why:  Please go as a walk-in to be established for outpatient services. Walk-in hours are Mon, Wed, Fri 12p-3pm. Please arrive as early as possible to be sure that you are seen. Thank you. Contact information: [address] [place] [address] [phone]           Next level of care provider [facility] Link:no  Safety Planning and Suicide Prevention discussed: Yes,  with pt.  Have you used any form of tobacco in the last 30 days? (Cigarettes, Smokeless Tobacco, Cigars, and/or Pipes): Yes  Has patient been referred to the Quitline?: Patient refused referral  Patient has been referred for addiction treatment: Yes  Jonathon JordanLynn B Cayetano Mikita, MSW, LCSWA 07/04/2017, 4:50 PM

## 2017-07-04 NOTE — Progress Notes (Signed)
Patient discharged to lobby. Patient was stable and appreciative at that time. All papers, samples and prescriptions were given and valuables returned. Verbal understanding expressed. Denies SI/HI and A/VH. Patient given opportunity to express concerns and ask questions.  
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
## 2017-07-04 NOTE — Progress Notes (Signed)
Pt attend wrap up group. Her day was a 8. Pt want to complain about the unit. Staff redirect pt her purpose for being here. Pt said her day was a 8. Her goal to develop a discharge plan.

## 2017-07-04 NOTE — [facility] Suicide Risk Assessment (Signed)
[facility] INPATIENT:  Family/Significant Other Suicide Prevention Education  Suicide Prevention Education:  Contact Attempts: Bonner PunaDianna Willard (aunt [phone]),  has been identified by the patient as the family member/significant other with whom the patient will be residing, and identified as the person(s) who will aid the patient in the event of a mental health crisis.  With written consent from the patient, two attempts were made to provide suicide prevention education, prior to and/or following the patient's discharge.  We were unsuccessful in providing suicide prevention education.  A suicide education pamphlet was given to the patient to share with family/significant other.  Date and time of first attempt: 07/04/17 at 2:00pm Date and time of second attempt: 07/04/17 at 3:47pm  Jonathon JordanLynn B Faust Thorington, MSW, LCSWA  07/04/2017, 4:49 PM

## 2017-07-04 NOTE — Progress Notes (Signed)
Patient is resting in bed with eyes closed. Respirations are even and non labored; no distress noted. Q 15 minute checks in progress and patient remains safe on unit. Monitoring continues.

## 2017-10-30 ENCOUNTER — Emergency Department (HOSPITAL_COMMUNITY)
Admission: EM | Admit: 2017-10-30 | Discharge: 2017-10-31 | Disposition: A | Discharge status: Home / Self Care | Payer: Self-pay | Attending: Emergency Medicine | Admitting: Emergency Medicine

## 2017-10-30 ENCOUNTER — Emergency Department (HOSPITAL_COMMUNITY): Payer: Self-pay

## 2017-10-30 ENCOUNTER — Encounter (HOSPITAL_COMMUNITY): Payer: Self-pay | Admitting: Emergency Medicine

## 2017-10-30 ENCOUNTER — Other Ambulatory Visit: Payer: Self-pay

## 2017-10-30 DIAGNOSIS — Y999 Unspecified external cause status: Secondary | ICD-10-CM | POA: Insufficient documentation

## 2017-10-30 DIAGNOSIS — F10920 Alcohol use, unspecified with intoxication, uncomplicated: Secondary | ICD-10-CM | POA: Insufficient documentation

## 2017-10-30 DIAGNOSIS — F319 Bipolar disorder, unspecified: Secondary | ICD-10-CM | POA: Insufficient documentation

## 2017-10-30 DIAGNOSIS — Y939 Activity, unspecified: Secondary | ICD-10-CM | POA: Insufficient documentation

## 2017-10-30 DIAGNOSIS — F329 Major depressive disorder, single episode, unspecified: Secondary | ICD-10-CM | POA: Insufficient documentation

## 2017-10-30 DIAGNOSIS — F1910 Other psychoactive substance abuse, uncomplicated: Secondary | ICD-10-CM

## 2017-10-30 DIAGNOSIS — Y929 Unspecified place or not applicable: Secondary | ICD-10-CM | POA: Insufficient documentation

## 2017-10-30 DIAGNOSIS — Z79899 Other long term (current) drug therapy: Secondary | ICD-10-CM | POA: Insufficient documentation

## 2017-10-30 DIAGNOSIS — S2242XA Multiple fractures of ribs, left side, initial encounter for closed fracture: Secondary | ICD-10-CM | POA: Insufficient documentation

## 2017-10-30 LAB — COMPREHENSIVE METABOLIC PANEL
ALBUMIN: 3.3 g/dL — AB (ref 3.5–5.0)
ALT: 17 U/L (ref 14–54)
AST: 31 U/L (ref 15–41)
Alkaline Phosphatase: 97 U/L (ref 38–126)
Anion gap: 9 (ref 5–15)
BILIRUBIN TOTAL: 0.3 mg/dL (ref 0.3–1.2)
BUN: 6 mg/dL (ref 6–20)
CHLORIDE: 107 mmol/L (ref 101–111)
CO2: 26 mmol/L (ref 22–32)
CREATININE: 0.93 mg/dL (ref 0.44–1.00)
Calcium: 8.7 mg/dL — ABNORMAL LOW (ref 8.9–10.3)
GFR calc Af Amer: >60 mL/min (ref >60)
GFR calc non Af Amer: >60 mL/min (ref >60)
GLUCOSE: 98 mg/dL (ref 65–99)
POTASSIUM: 3.8 mmol/L (ref 3.5–5.1)
Sodium: 142 mmol/L (ref 135–145)
Total Protein: 6.9 g/dL (ref 6.5–8.1)

## 2017-10-30 LAB — CBC
HCT: 36.1 % (ref 36.0–46.0)
HEMOGLOBIN: 10.7 g/dL — AB (ref 12.0–15.0)
MCH: 26.8 pg (ref 26.0–34.0)
MCHC: 29.6 g/dL — AB (ref 30.0–36.0)
MCV: 90.3 fL (ref 78.0–100.0)
Platelets: 383 10*3/uL (ref 150–400)
RBC: 4 MIL/uL (ref 3.87–5.11)
RDW: 21.2 % — AB (ref 11.5–15.5)
WBC: 5.4 10*3/uL (ref 4.0–10.5)

## 2017-10-30 LAB — RAPID URINE DRUG SCREEN, HOSP PERFORMED
Amphetamines: POSITIVE — AB
BARBITURATES: NOT DETECTED
Benzodiazepines: NOT DETECTED
Cocaine: POSITIVE — AB
OPIATES: NOT DETECTED
Tetrahydrocannabinol: POSITIVE — AB

## 2017-10-30 LAB — POC URINE PREG, ED: Preg Test, Ur: NEGATIVE

## 2017-10-30 LAB — ETHANOL: ALCOHOL ETHYL (B): 245 mg/dL — AB (ref <10)
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
## 2017-10-30 NOTE — ED Notes (Signed)
Pt provided snacks and frozen dinner tray.

## 2017-10-30 NOTE — ED Notes (Signed)
Pt wanded by security prior to finishing triage. [place] officer stating he would stay with pt until she was placed in a room.

## 2017-10-30 NOTE — ED Notes (Signed)
Pts belongins secured in locker. Security wanded Pt again. Pt placed in paper scrubs and placed in Room 4. Room 4 has been stripped and secured.

## 2017-10-30 NOTE — ED Notes (Signed)
ED Provider at bedside. 

## 2017-10-30 NOTE — ED Notes (Signed)
Pt returned from X Ray.

## 2017-10-30 NOTE — ED Triage Notes (Signed)
Per [place] police a crisis [employer] worker could not give pt a ride and PD was dispatched to assist with a possible SI pt. Pt states she has not seen her kids in 1 year and this is an ongoing problem. Denies plan for suicide. States she is not SI/HI, AVH. Pt is voluntary at this time. Has drank 2-3 four Loko and used cocaine tonight.

## 2017-10-30 NOTE — ED Notes (Signed)
Patient transported to X-ray 

## 2017-10-31 ENCOUNTER — Emergency Department (HOSPITAL_COMMUNITY): Payer: Self-pay

## 2017-10-31 LAB — ACETAMINOPHEN LEVEL
Acetaminophen (Tylenol), Serum: 12 ug/mL (ref 10–30)
Acetaminophen (Tylenol), Serum: <10 ug/mL — ABNORMAL LOW (ref 10–30)

## 2017-10-31 LAB — SALICYLATE LEVEL: Salicylate Lvl: <7 mg/dL (ref 2.8–30.0)

## 2017-10-31 MED ORDER — THIAMINE HCL 100 MG/ML IJ SOLN: 100.0000 mg | Freq: Every day | INTRAMUSCULAR | Status: DC

## 2017-10-31 MED ORDER — IBUPROFEN 800 MG PO TABS: 800.0000 mg | ORAL_TABLET | Freq: Three times a day (TID) | ORAL | 0 refills | Status: AC

## 2017-10-31 MED ORDER — ADULT MULTIVITAMIN W/MINERALS CH: 1.0000 | ORAL_TABLET | Freq: Every day | ORAL | Status: DC

## 2017-10-31 MED ORDER — LORAZEPAM 2 MG/ML IJ SOLN: 1.0000 mg | Freq: Four times a day (QID) | INTRAMUSCULAR | Status: DC | PRN

## 2017-10-31 MED ORDER — LORAZEPAM 1 MG PO TABS: 1.0000 mg | ORAL_TABLET | Freq: Four times a day (QID) | ORAL | Status: DC | PRN

## 2017-10-31 MED ORDER — LORAZEPAM 1 MG PO TABS
0.0000 mg | ORAL_TABLET | Freq: Four times a day (QID) | ORAL | Status: DC
  Administered 2017-10-31: 1 mg via ORAL
  Filled 2017-10-31: qty 1

## 2017-10-31 MED ORDER — VITAMIN B-1 100 MG PO TABS: 100.0000 mg | ORAL_TABLET | Freq: Every day | ORAL | Status: DC

## 2017-10-31 MED ORDER — LORAZEPAM 1 MG PO TABS: 0.0000 mg | ORAL_TABLET | Freq: Two times a day (BID) | ORAL | Status: DC

## 2017-10-31 MED ORDER — FOLIC ACID 1 MG PO TABS: 1.0000 mg | ORAL_TABLET | Freq: Every day | ORAL | Status: DC
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
## 2017-10-31 NOTE — ED Notes (Signed)
Pt attempting to call mother for a ride home.  No answer.

## 2017-10-31 NOTE — ED Notes (Signed)
Pt left with mother.

## 2017-10-31 NOTE — ED Notes (Signed)
Pt calling her mother to come get her.  Notified respiratory therapy for incentive spirometry teaching.

## 2017-10-31 NOTE — ED Notes (Signed)
TTS at Bedside 

## 2017-10-31 NOTE — ED Notes (Signed)
Patient returned from XRay

## 2017-10-31 NOTE — ED Notes (Signed)
Patient transported to X-ray 
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
## 2017-10-31 NOTE — ED Notes (Signed)
[facility] and is cleared for medical evaluation and discharge. Patient's belongings have been returned and room returned to medical condition for Emergency Patients.

## 2017-10-31 NOTE — ED Notes (Signed)
Pt states she lives at home with her mother. Mother is the one who called [place] Police stating the Pt was threatening to kill herself. Pt denies having a plan at this time, but does have intermittent SI.

## 2018-07-19 IMAGING — DX DG CHEST 2V
2 series · 2 of 2 positions shown · non-contrast
Comparison: 06/29/2017

CLINICAL DATA: Left chest pain. Multiple left rib fractures.
Followup pneumothorax.

EXAM:
CHEST - 2 VIEW

[chest pa]
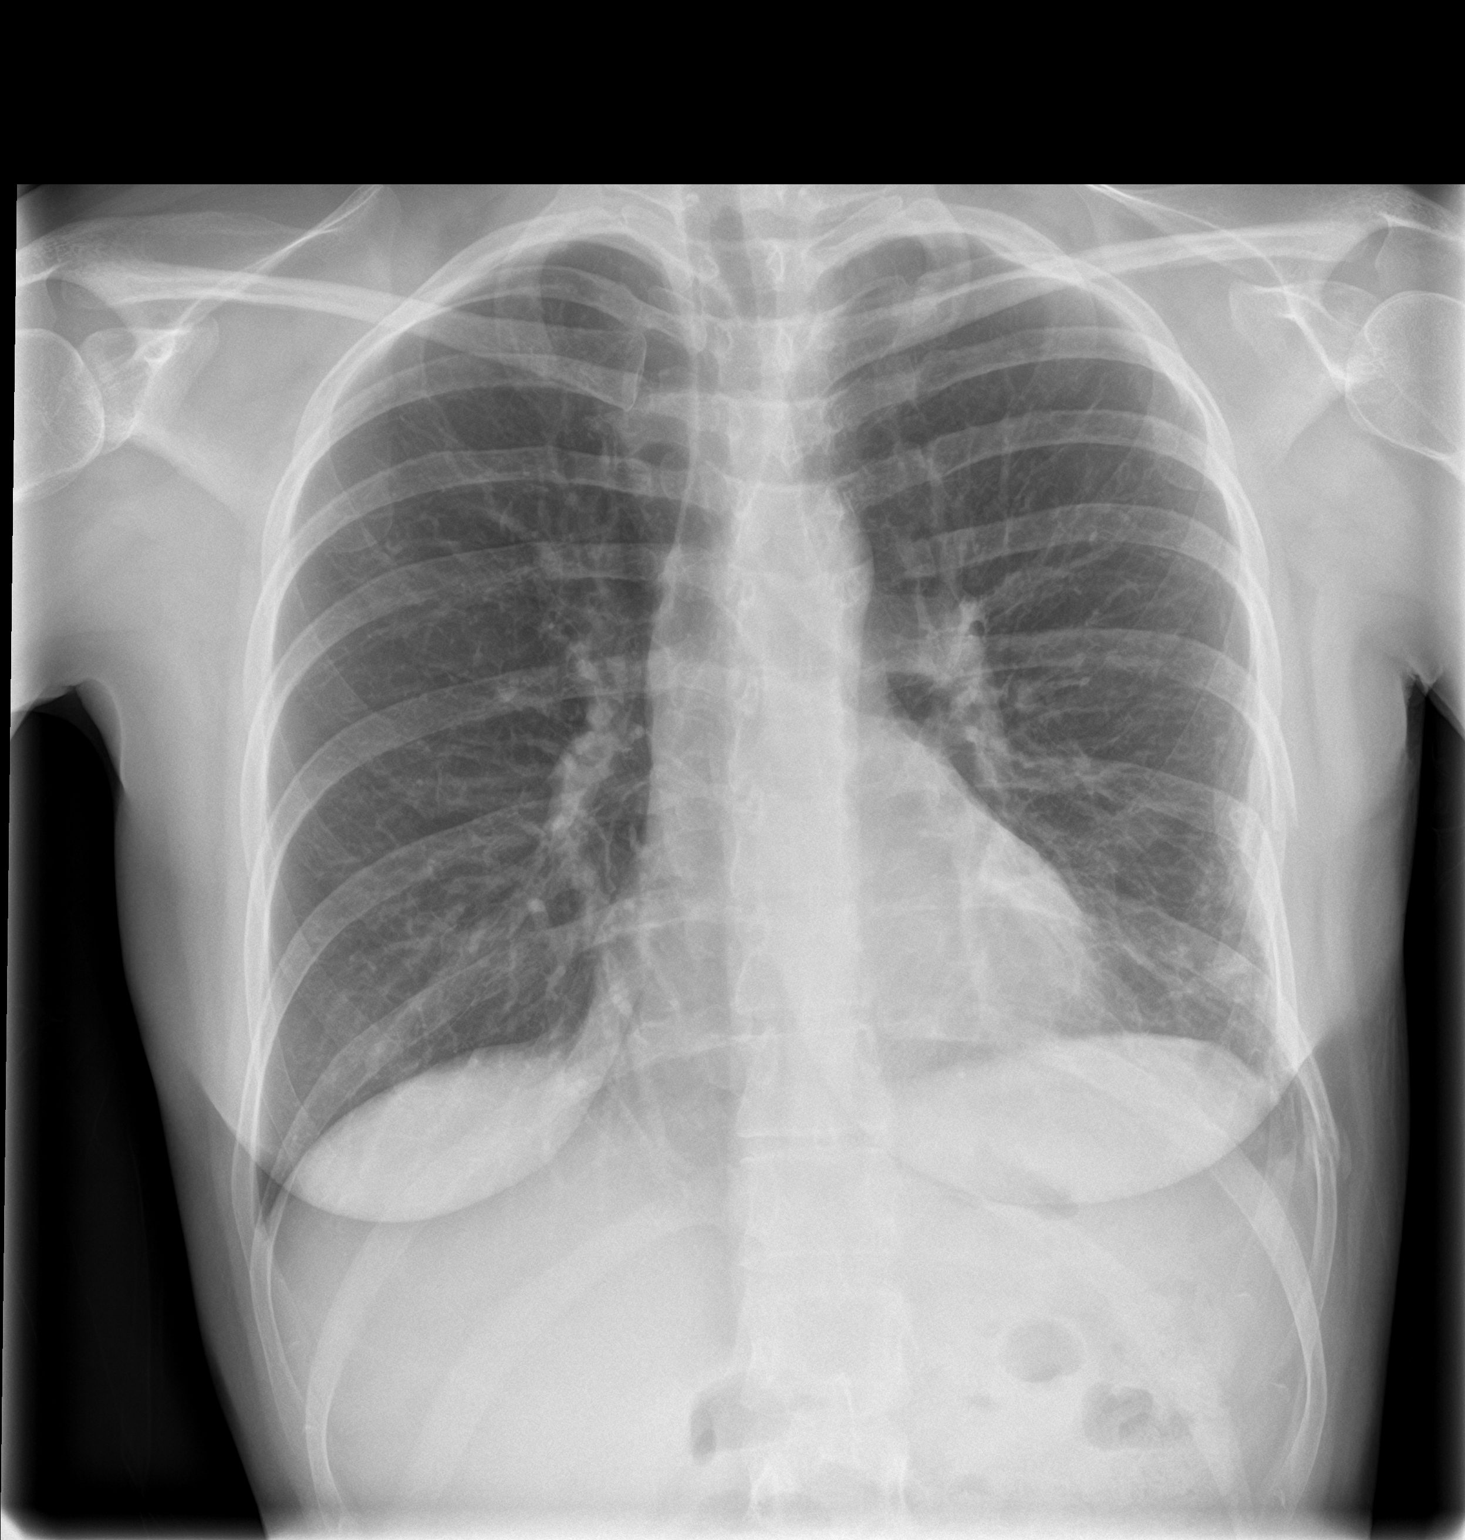

[chest lat]
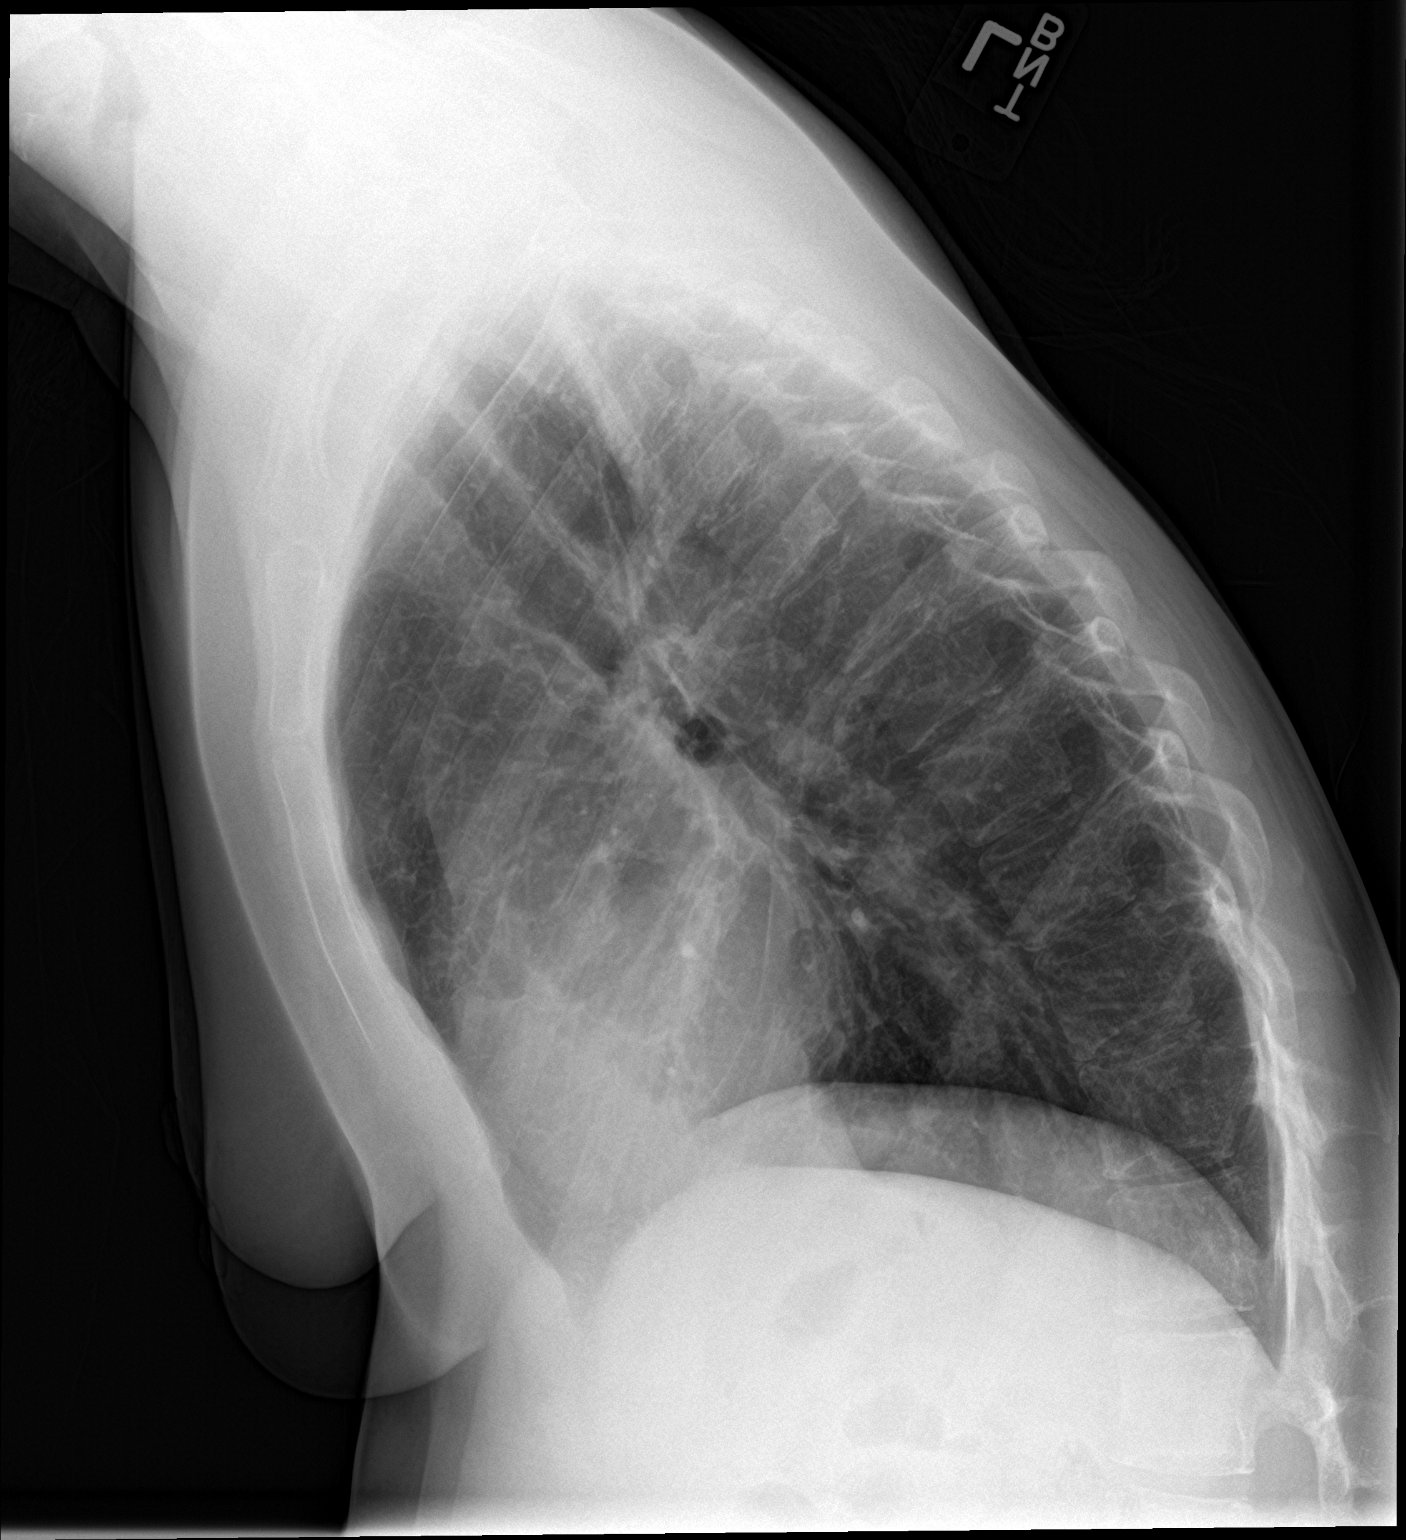

[2 of 2 positions shown; findings below may reference images not displayed]

FINDINGS: The heart size and mediastinal contours are within normal limits.
Mild left basilar atelectasis is seen. No evidence of pneumothorax
or hemothorax on today's study. Multiple mildly displaced left
lateral rib fractures again noted.
IMPRESSION: Mild left basilar atelectasis. No evidence of pneumothorax or
hemothorax.

Multiple left rib fractures.
